# Patient Record
Sex: Male | Born: 1955 | Race: White | Hispanic: No | Marital: Married | Smoking: Former smoker
Health system: Southern US, Community
[De-identification: ages and names within clinical notes are randomized; demographics above are authoritative.]

## PROBLEM LIST (undated history)

## (undated) DIAGNOSIS — T4145XA Adverse effect of unspecified anesthetic, initial encounter: Secondary | ICD-10-CM

## (undated) DIAGNOSIS — T8859XA Other complications of anesthesia, initial encounter: Secondary | ICD-10-CM

## (undated) DIAGNOSIS — G4730 Sleep apnea, unspecified: Secondary | ICD-10-CM

## (undated) DIAGNOSIS — M5137 Other intervertebral disc degeneration, lumbosacral region: Secondary | ICD-10-CM

## (undated) DIAGNOSIS — F419 Anxiety disorder, unspecified: Secondary | ICD-10-CM

## (undated) DIAGNOSIS — M51379 Other intervertebral disc degeneration, lumbosacral region without mention of lumbar back pain or lower extremity pain: Secondary | ICD-10-CM

## (undated) DIAGNOSIS — I10 Essential (primary) hypertension: Secondary | ICD-10-CM

## (undated) HISTORY — DX: Other intervertebral disc degeneration, lumbosacral region without mention of lumbar back pain or lower extremity pain: M51.379

## (undated) HISTORY — PX: APPENDECTOMY: SHX54

## (undated) HISTORY — DX: Other intervertebral disc degeneration, lumbosacral region: M51.37

## (undated) HISTORY — DX: Morbid (severe) obesity due to excess calories: E66.01

## (undated) HISTORY — DX: Sleep apnea, unspecified: G47.30

## (undated) HISTORY — DX: Essential (primary) hypertension: I10

---

## 2006-01-12 HISTORY — PX: ROTATOR CUFF REPAIR: SHX139

## 2006-01-12 HISTORY — PX: ANTERIOR FUSION CERVICAL SPINE: SUR626

## 2007-01-13 HISTORY — PX: POSTERIOR CERVICAL FUSION/FORAMINOTOMY: SHX5038

## 2012-06-15 ENCOUNTER — Ambulatory Visit (INDEPENDENT_AMBULATORY_CARE_PROVIDER_SITE_OTHER): Payer: Managed Care, Other (non HMO) | Admitting: General Surgery

## 2012-06-30 ENCOUNTER — Encounter (INDEPENDENT_AMBULATORY_CARE_PROVIDER_SITE_OTHER): Payer: Self-pay | Admitting: Surgery

## 2012-06-30 ENCOUNTER — Ambulatory Visit (INDEPENDENT_AMBULATORY_CARE_PROVIDER_SITE_OTHER): Payer: Managed Care, Other (non HMO) | Admitting: Surgery

## 2012-06-30 ENCOUNTER — Other Ambulatory Visit (INDEPENDENT_AMBULATORY_CARE_PROVIDER_SITE_OTHER): Payer: Self-pay

## 2012-06-30 DIAGNOSIS — M542 Cervicalgia: Secondary | ICD-10-CM

## 2012-06-30 DIAGNOSIS — Z6841 Body Mass Index (BMI) 40.0 and over, adult: Secondary | ICD-10-CM

## 2012-06-30 DIAGNOSIS — E039 Hypothyroidism, unspecified: Secondary | ICD-10-CM

## 2012-06-30 DIAGNOSIS — M549 Dorsalgia, unspecified: Secondary | ICD-10-CM

## 2012-06-30 DIAGNOSIS — I10 Essential (primary) hypertension: Secondary | ICD-10-CM

## 2012-06-30 NOTE — Progress Notes (Addendum)
Re:   Fred Acosta DOB:   1955/03/15 MRN:   [id number]  ASSESSMENT AND PLAN: 1.  Morbid obesity,   Initial weight - 272, BMI - 42.03  The patient is interested in the lap sleeve.  Per the 1991 NIH Consensus Statement, the patient is a candidate for bariatric surgery.  The patient attended our initial information session and reviewed the types of bariatric surgery.    The patient is interested in the laparoscopic sleeve gastrectomy.  I discussed with the patient the indications and risks of bariatric surgery.  The potential risks of surgery include, but are not limited to, bleeding, infection, leak from the bowel, DVT and PE, open surgery, long term nutrition consequences, and death.  The patient understands the importance of compliance and long term follow-up with our group after surgery.  From here we will obtain lab tests, x-rays, nutrition consult, and psych consult.  I told the patient that Dr. Biagio Quint is our primary sleeve surgeon and that I am just getting started in this procedure.  He would like to see Dr. Biagio Quint.  I can get the ball rolling and he will see Dr. Biagio Quint preoperatively.  2.  Chronic neck/back pain issues  Has pain pump placed and followed by [facility].  Takes oxycodone, though the pain pump has helped him cut back of the medicine. 3.  Hypertension  Just started on meds in the last week. 4.  Used to smoke heavily.  Now smokes 1 - 3 cigs per day.  He knows that he has to stop at least 4 weeks prior to any surgery 5.  On disability for back pain since 2007. 6.  History of anxiety.  Chief Complaint  Patient presents with  . Bariatric Pre-op   REFERRING PHYSICIAN:  Dr. Clement Husbands, [place]  (FAX [phone])  HISTORY OF PRESENT ILLNESS: Fred Acosta is a 57 y.o. (DOB: 11/27/1955)  white  male whose primary care physician is Dr. Clement Husbands, [place]  (FAX [phone]) and comes to me today for evaluation for bariatric  surgery.  He sees Dr. Clement Husbands in [place], [place].   The patient went to an information session that Dr. Ezzard Standing spoke at. He is interested in a sleeve gastrectomy.   And he has looked at all options for weight loss surgery.  He has tried multiple diets including: Atkins diet, [employer], calorie count, and no fat diet. He has not tried any weight loss medications either prescription or nonprescription. He says he is on a lot of research on all options for weight loss surgery and thinks that the lap sleeve gastrectomy is his best option.  We talked about his pain pump and when to remove it.  But I think that he is best leaving this in to see how his weight loss affects his chronic pain issues.  And even though we hope that significant weight loss will help his pain, I discussed with him that bariatric surgery (any of the operations) has not been very effective improving chronic pain, no matter which operation is used.  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 I talked with him that Dr. Biagio Quint does most of lap sleeves.  And he would be one of my first sleeves, if he let me do the surgery.  He is interested in seeing Dr. Biagio Quint.    He also needs to  bring his wife with him to the next visit.   No past medical history on file.    Past Surgical History  Procedure Laterality Date  . Rotator cuff repair  2008  . Anterior fusion cervical spine  2008  . Posterior cervical fusion/foraminotomy  2009     Current Outpatient Prescriptions  Medication Sig Dispense Refill  . bethanechol (URECHOLINE) 10 MG tablet 10 mg daily. Three 10mg  qd/30mg       . lisinopril (PRINIVIL,ZESTRIL) 10 MG tablet 10 mg daily.       . MORPHINE SULFATE MICROINFUSION IJ Inject as directed.      . Oxycodone HCl 10 MG TABS 10 mg daily. Four 10mg  qd       No current facility-administered medications for this visit.      No Known Allergies  REVIEW OF SYSTEMS: Skin:  No history of rash.  No history of abnormal moles. Infection:  No history of hepatitis or HIV.  No history of MRSA. Neurologic:  No history of stroke.  No history of seizure.  No history of headaches. Cardiac:  Hypertension.  Just started on meds. No history of heart disease.  No history of prior cardiac catheterization.  No history of seeing a cardiologist. Pulmonary:  Used to be a heavy smoker.  Now smokes 1 to 3 cigarettes/day.  He knows that he need to stop smoking at least one month ahead of the operation.  Endocrine:  No diabetes. No thyroid disease. Gastrointestinal:  No history of stomach disease.  No history of liver disease.  No history of gall bladder disease.  No history of pancreas disease.  No history of colon disease. He had an appendectomy about 1999.  Colonoscopy around 2007. Urologic:  No history of kidney stones.  No history of bladder infections. Musculoskeletal:  Has had 2 neck operations - 2009 - Dr. Rogelia Boga in [place], and 2011 - Dr. Manson Passey at [facility].  He continues to have pain issues.  He said that his SI joint on the right is bad and he has sciatic nerve pain down his right leg.  He is seen at the [facility] in [place]. Hematologic:  No bleeding disorder.  No history of anemia.  Not anticoagulated. Psycho-social:  The patient is oriented.   The patient has no obvious psychologic or social impairment to understanding our conversation and plan.  SOCIAL and FAMILY HISTORY: Married.  Wife's name is Velna Hatchet. Wife works as Physiological scientist a company that makes chaff (glass and aluminum) used by [employer] to [employer]. They have one son - 84 yo.  PHYSICAL EXAM: BP 138/84  Pulse 84  Temp(Src) 98.4 F (36.9 C)  Resp 18  Ht 5' 7.5" (1.715 m)  Wt 272 lb 6.4 oz (123.56 kg)  BMI 42.01 kg/m2  General: WN obese WM who is alert and generally healthy appearing.  HEENT: Normal. Pupils  equal. Neck: Supple. No mass.  No thyroid mass.  Stout neck. Lymph Nodes:  No supraclavicular or cervical nodes. Lungs: Clear to auscultation and symmetric breath sounds. Heart:  RRR. No murmur or rub. Abdomen: Soft. No tenderness. No hernia. Normal bowel sounds.  Pain pump is RUQ, about 6 cm across.  He is about 1/2 apple and 1/2 pear in shape.  Scar from appendectomy. Rectal: Not done. Extremities:  Good strength and ROM  in upper and lower extremities. Neurologic:  Grossly intact to motor and sensory function. Psychiatric: Has normal mood and affect. Behavior is normal.  DATA REVIEWED: Notes from [facility], Dr. Electa Sniff.  Ovidio Kin, MD,  [facility] [address]  [address]   [place]    [address] Phone:  [phone] FAX:  [phone]

## 2012-07-25 ENCOUNTER — Encounter (INDEPENDENT_AMBULATORY_CARE_PROVIDER_SITE_OTHER): Payer: Self-pay

## 2012-08-01 ENCOUNTER — Ambulatory Visit: Payer: Self-pay | Admitting: *Deleted

## 2012-08-02 ENCOUNTER — Other Ambulatory Visit (INDEPENDENT_AMBULATORY_CARE_PROVIDER_SITE_OTHER): Payer: Self-pay | Admitting: Surgery

## 2012-08-02 ENCOUNTER — Inpatient Hospital Stay (HOSPITAL_COMMUNITY): Admission: RE | Admit: 2012-08-02 | Payer: Self-pay | Source: Ambulatory Visit

## 2012-08-02 ENCOUNTER — Other Ambulatory Visit: Payer: Self-pay

## 2012-08-02 ENCOUNTER — Ambulatory Visit (HOSPITAL_COMMUNITY): Payer: Private Health Insurance - Indemnity

## 2012-08-02 ENCOUNTER — Ambulatory Visit (HOSPITAL_COMMUNITY)
Admission: RE | Admit: 2012-08-02 | Discharge: 2012-08-02 | Disposition: A | Discharge status: Home / Self Care | Payer: Private Health Insurance - Indemnity | Source: Ambulatory Visit | Attending: Surgery | Admitting: Surgery

## 2012-08-02 ENCOUNTER — Encounter: Payer: Self-pay | Admitting: *Deleted

## 2012-08-02 ENCOUNTER — Encounter: Payer: Private Health Insurance - Indemnity | Attending: Surgery | Admitting: *Deleted

## 2012-08-02 DIAGNOSIS — M549 Dorsalgia, unspecified: Secondary | ICD-10-CM | POA: Insufficient documentation

## 2012-08-02 DIAGNOSIS — G8929 Other chronic pain: Secondary | ICD-10-CM | POA: Insufficient documentation

## 2012-08-02 DIAGNOSIS — K7689 Other specified diseases of liver: Secondary | ICD-10-CM | POA: Insufficient documentation

## 2012-08-02 DIAGNOSIS — F17200 Nicotine dependence, unspecified, uncomplicated: Secondary | ICD-10-CM | POA: Insufficient documentation

## 2012-08-02 DIAGNOSIS — Z713 Dietary counseling and surveillance: Secondary | ICD-10-CM | POA: Insufficient documentation

## 2012-08-02 DIAGNOSIS — Z6841 Body Mass Index (BMI) 40.0 and over, adult: Secondary | ICD-10-CM | POA: Insufficient documentation

## 2012-08-02 DIAGNOSIS — I10 Essential (primary) hypertension: Secondary | ICD-10-CM | POA: Insufficient documentation

## 2012-08-02 DIAGNOSIS — I700 Atherosclerosis of aorta: Secondary | ICD-10-CM | POA: Insufficient documentation

## 2012-08-02 DIAGNOSIS — K8020 Calculus of gallbladder without cholecystitis without obstruction: Secondary | ICD-10-CM | POA: Insufficient documentation

## 2012-08-02 DIAGNOSIS — K838 Other specified diseases of biliary tract: Secondary | ICD-10-CM | POA: Insufficient documentation

## 2012-08-02 DIAGNOSIS — E039 Hypothyroidism, unspecified: Secondary | ICD-10-CM

## 2012-08-02 LAB — COMPREHENSIVE METABOLIC PANEL
ALT: 18 U/L (ref 0–53)
BUN: 26 mg/dL — ABNORMAL HIGH (ref 6–23)
CO2: 29 mEq/L (ref 19–32)
Creat: 1.15 mg/dL (ref 0.50–1.35)
Total Bilirubin: 0.4 mg/dL (ref 0.3–1.2)

## 2012-08-02 LAB — CBC WITH DIFFERENTIAL/PLATELET
Eosinophils Absolute: 0.2 10*3/uL (ref 0.0–0.7)
Eosinophils Relative: 2 % (ref 0–5)
Lymphs Abs: 2.1 10*3/uL (ref 0.7–4.0)
MCH: 29.2 pg (ref 26.0–34.0)
MCV: 84.7 fL (ref 78.0–100.0)
Platelets: 183 10*3/uL (ref 150–400)
RBC: 4.38 MIL/uL (ref 4.22–5.81)
RDW: 14.4 % (ref 11.5–15.5)

## 2012-08-02 LAB — TSH: TSH: 3.64 u[IU]/mL (ref 0.350–4.500)

## 2012-08-02 NOTE — Progress Notes (Addendum)
  Pre-Op Assessment Visit:  Pre-Operative Sleeve Gastrectomy Surgery  Medical Nutrition Therapy:  Appt start time: 1130   End time:  1230.  Patient was seen on 08/02/2012 for Pre-Operative Sleeve Gastrectomy Nutrition Assessment. Assessment and letter of approval faxed to [facility] Bariatric Surgery Program coordinator on 08/02/2012.   Handouts given during visit include:  Pre-Op Goals Bariatric Surgery Protein Shakes  Patient to call the [facility] to enroll in Pre-Op and Post-Op Nutrition Education when surgery date is scheduled.

## 2012-08-02 NOTE — Patient Instructions (Addendum)
   Follow Pre-Op Nutrition Goals to prepare for Sleeve Gastrectomy Surgery.   Call the [facility] at [phone] once you have been given your surgery date to enrolled in the Pre-Op Nutrition Class. You will need to attend this nutrition class 3-4 weeks prior to your surgery.  *Try PB2 powdered peanut butter ($4.99 @ [employer] and [employer]) - in peanut butter section

## 2012-08-12 ENCOUNTER — Ambulatory Visit (INDEPENDENT_AMBULATORY_CARE_PROVIDER_SITE_OTHER): Payer: Managed Care, Other (non HMO) | Admitting: General Surgery

## 2012-08-26 ENCOUNTER — Telehealth (INDEPENDENT_AMBULATORY_CARE_PROVIDER_SITE_OTHER): Payer: Self-pay

## 2012-08-26 NOTE — Telephone Encounter (Signed)
Return call from patient - patient advised of his upcoming appointment with Dr. Biagio Quint.  Patient advised that per Dr. Biagio Quint he need's to stop smoking and have not smoked or use any tobacco related products for at least 30 day's.  Patient states he smokes the electronic cigarettes and wasn't sure if this would be okay or not.  I advised patient that I was not sure if there's nicotine in the e-cig but, would find out and call patient back once I research this further.

## 2012-08-26 NOTE — Telephone Encounter (Signed)
Called and left message for patient to call our office.  RE:  Patient has upcoming appointment on 09/07/12 w/Dr. Biagio Quint for new bariatric initial, per Dr. Biagio Quint patient need's to have stopped smoking.  No exceptions, patient will not be scheduled for bariatric surgery if he has any trace of nicotine or any nicotine metabolites such as Chantix, Nicotine Gum, Patches, etc.  Patient was advised before appointment was made that he needed to stop smoking prior to patient being seen on his initial appointment, per Dr. Biagio Quint

## 2012-08-26 NOTE — Telephone Encounter (Signed)
Message copied by Maryan Puls on Fri Aug 26, 2012  1:54 PM ------      Message from: Lodema Pilot      Created: Mon Jul 04, 2012 10:52 AM      Regarding: RE: A sleeve patient       Thanks for the referral.  I would be happy to see him back.  I did notice that he is smoking and I will not do any sleeve on a current smoker.  Caelan Atchley, please make sure that I can see him back with plenty of time to make sure that he is not smoking and to do the usual preop tests.  I will do a nicotine test on these patients preoperatively to ensure that they have truly stopped smoking.            ----- Message -----         From: Kandis Cocking, MD         Sent: 06/30/2012   7:04 PM           To: Leandrew Koyanagi, Lodema Pilot, DO, #      Subject: A sleeve patient                                         Arlys John,      I saw this guy in the office today.  Unfortunately, no one picked up that he wanted a sleeve.      He comes from [place], which is about 1 [place]/2 hours from here, so I did not want to just make him come back.      I have worked him up and you can look at my note.  He is to see you at least 3 weeks before any surgery is scheduled to make sure all the bases are covered.  Also I am just about ready to do sleeves, but he did not want to be one of my first patients.      Talk to you later,            Onalee Hua             ------

## 2012-08-29 NOTE — Telephone Encounter (Signed)
Reviewed with Dr. Biagio Quint Electronic Cigarettes.  Per Dr. Biagio Quint anything that contains Nicotine surgery will be pushed out.  Patient MUST BE 30 consectuive day's without any form of Nicotine which includes:  Patch, Chantix, E-cigs, Nicotine Gum, etc;  Patient aware of this and understands that Dr. Biagio Quint will conduct a blood and urine test to confirm levels of Nicotine.  Patient again verbalized understanding.

## 2012-08-31 ENCOUNTER — Ambulatory Visit (INDEPENDENT_AMBULATORY_CARE_PROVIDER_SITE_OTHER): Payer: Managed Care, Other (non HMO) | Admitting: General Surgery

## 2012-09-07 ENCOUNTER — Encounter (INDEPENDENT_AMBULATORY_CARE_PROVIDER_SITE_OTHER): Payer: Self-pay | Admitting: General Surgery

## 2012-09-07 ENCOUNTER — Ambulatory Visit (INDEPENDENT_AMBULATORY_CARE_PROVIDER_SITE_OTHER): Payer: Managed Care, Other (non HMO) | Admitting: General Surgery

## 2012-09-07 ENCOUNTER — Encounter (HOSPITAL_COMMUNITY)
Admission: RE | Disposition: A | Discharge status: Home / Self Care | Payer: Self-pay | Source: Ambulatory Visit | Attending: Surgery

## 2012-09-07 ENCOUNTER — Ambulatory Visit (HOSPITAL_COMMUNITY)
Admission: RE | Admit: 2012-09-07 | Discharge: 2012-09-07 | Disposition: A | Discharge status: Home / Self Care | Payer: Managed Care, Other (non HMO) | Source: Ambulatory Visit | Attending: Surgery | Admitting: Surgery

## 2012-09-07 DIAGNOSIS — F17200 Nicotine dependence, unspecified, uncomplicated: Secondary | ICD-10-CM

## 2012-09-07 HISTORY — PX: BREATH TEK H PYLORI: SHX5422

## 2012-09-07 SURGERY — BREATH TEST, FOR HELICOBACTER PYLORI

## 2012-09-07 NOTE — Progress Notes (Signed)
Patient ID: Fred Acosta, male   DOB: March 13, 1955, 57 y.o.   MRN: [id number]  Chief Complaint  Patient presents with  . Bariatric Pre-op    HPI NILSON Acosta is a 57 y.o. male. This patient comes in today for evaluation for possible vertical sleeve gastrectomy. He initially saw Dr. Ezzard Standing and expresses interest in sleeve gastrectomy and he was referred to me. He has a tender information session and has a BMI of 42 with obstructive sleep apnea hypertension and back and hip pain. He says that he struggled with his weight for "quite a while". He has tried several diets including Atkins diet, low fat diet, [employer] but was most effective with the [employer] when he lost about 20 pounds but he regained the weight. He says that he has some chronic back and hip pain and his exercise is limited by this but he can walk about one to 2 blocks before he has the pain. He denies any heartburn or reflux. HPI  Past Medical History  Diagnosis Date  . Morbid obesity   . Hypertension   . Sleep apnea     Unable to tolerate CPAP machine  . DDD (degenerative disc disease), lumbosacral     Past Surgical History  Procedure Laterality Date  . Rotator cuff repair  2008  . Anterior fusion cervical spine  2008  . Posterior cervical fusion/foraminotomy  2009    Family History  Problem Relation Age of Onset  . Heart disease Father   . Stroke Father     Social History History  Substance Use Topics  . Smoking status: Light Tobacco Smoker -- 0.13 packs/day    Types: Cigarettes    Last Attempt to Quit: 08/03/2010  . Smokeless tobacco: Not on file  . Alcohol Use: No    No Known Allergies  No current outpatient prescriptions on file.   No current facility-administered medications for this visit.    Review of Systems Review of Systems All other review of systems negative or noncontributory except as stated in the HPI  Blood pressure 128/64, pulse 62, resp. rate 14, height 5' 7.5" (1.715  m), weight 275 lb 6.4 oz (124.921 kg).  Physical Exam Physical Exam Physical Exam  Physical Exam  Vitals reviewed. Constitutional: He is oriented to person, place, and time. He appears well-developed and well-nourished. No distress.  HENT:  Head: Normocephalic and atraumatic.  Mouth/Throat: No oropharyngeal exudate.  Eyes: Conjunctivae and EOM are normal. Pupils are equal, round, and reactive to light. Right eye exhibits no discharge. Left eye exhibits no discharge. No scleral icterus.  Neck: Normal range of motion. No tracheal deviation present.  Cardiovascular: Normal rate, regular rhythm and normal heart sounds.   Pulmonary/Chest: Effort normal and breath sounds normal. No stridor. No respiratory distress. He has no wheezes. He has no rales. He exhibits no tenderness.  Abdominal: Soft. Bowel sounds are normal. He exhibits no distension and no mass. There is no tenderness. There is no rebound and no guarding.  Musculoskeletal: Normal range of motion. He exhibits no edema and no tenderness.  Neurological: He is alert and oriented to person, place, and time.  Skin: Skin is warm and dry. No rash noted. He is not diaphoretic. No erythema. No pallor.  Psychiatric: He has a normal mood and affect. His behavior is normal. Judgment and thought content normal.      Data Reviewed   Assessment    Morbid obesity with a BMI of 42 and obesity  related comorbidities of obstructive sleep apnea requiring CPAP, hypertension, back and hip pain We discussed all the nonsurgical and surgical options for weight loss including the lap band, sleeve gastrectomy,  gastric bypass. He would be a fine candidate for any of these options and he chooses when he can stop smoking. I discussed with him each of the procedures and their risks and he remains interested in sleeve gastrectomy.  The risks of infection, bleeding, pain, scarring, weight regain, too little or too much weight loss, vitamin deficiencies and need  for lifelong vitamin supplementation, hair loss, need for protein supplementation, leaks, stricture, reflux, food intolerance, need for reoperation and conversion to roux Y gastric bypass, need for open surgery, injury to spleen or surrounding structures, DVT's, PE, and death again discussed with the patient and the patient expressed understanding and desires to proceed with laparoscopic vertical sleeve gastrectomy, possible open, intraoperative endoscopy.  Explain the he has a higher competition rates associated with smoking and will not offer any of these options he is smoking. He has completed most of his workup and will be ready for surgery when he can stop smoking.          Plan    We will set him up for his psychology evaluation and he will need smoking cessation prior to scheduling any surgery.       Lodema Pilot DAVID 09/07/2012, 9:35 AM

## 2012-09-12 ENCOUNTER — Encounter (HOSPITAL_COMMUNITY): Payer: Self-pay | Admitting: Surgery

## 2012-10-17 ENCOUNTER — Telehealth (INDEPENDENT_AMBULATORY_CARE_PROVIDER_SITE_OTHER): Payer: Self-pay | Admitting: *Deleted

## 2012-10-17 DIAGNOSIS — Z01812 Encounter for preprocedural laboratory examination: Secondary | ICD-10-CM

## 2012-10-17 DIAGNOSIS — Z87891 Personal history of nicotine dependence: Secondary | ICD-10-CM

## 2012-10-17 NOTE — Addendum Note (Signed)
Addended by: Maryan Puls on: 10/17/2012 02:23 PM   Modules accepted: Orders

## 2012-10-17 NOTE — Telephone Encounter (Addendum)
Called and spoke to patient.  He would like to have his Blood Nicotine test completed at his PCP.  Order will be entered in [facility] and mailed to patient per his request

## 2012-10-17 NOTE — Telephone Encounter (Signed)
Patient called this morning asking to speak with [facility].  Explained that she is working with a MD at this time and offered to help however patient states he just needs to speak with her and ask her some questions.  Explained that a message will be sent to her to ask for her to call patient when she is able too.

## 2012-10-17 NOTE — Telephone Encounter (Signed)
Lab order slip for Nicotine Blood Work mailed to patient home address as listed in patient medical record (EPIC)

## 2012-10-27 ENCOUNTER — Other Ambulatory Visit (INDEPENDENT_AMBULATORY_CARE_PROVIDER_SITE_OTHER): Payer: Self-pay | Admitting: General Surgery

## 2012-10-29 ENCOUNTER — Encounter (INDEPENDENT_AMBULATORY_CARE_PROVIDER_SITE_OTHER): Payer: Self-pay | Admitting: General Surgery

## 2012-11-07 ENCOUNTER — Encounter (INDEPENDENT_AMBULATORY_CARE_PROVIDER_SITE_OTHER): Payer: Self-pay | Admitting: General Surgery

## 2012-11-08 ENCOUNTER — Encounter (INDEPENDENT_AMBULATORY_CARE_PROVIDER_SITE_OTHER): Payer: Self-pay | Admitting: General Surgery

## 2012-11-08 ENCOUNTER — Telehealth (INDEPENDENT_AMBULATORY_CARE_PROVIDER_SITE_OTHER): Payer: Self-pay

## 2012-11-08 NOTE — Telephone Encounter (Signed)
Called and left message for patient to call out office to discuss questions sent via [facility].

## 2012-11-17 ENCOUNTER — Other Ambulatory Visit: Payer: Self-pay

## 2012-11-23 ENCOUNTER — Encounter (INDEPENDENT_AMBULATORY_CARE_PROVIDER_SITE_OTHER): Payer: Self-pay | Admitting: General Surgery

## 2012-11-23 ENCOUNTER — Encounter (INDEPENDENT_AMBULATORY_CARE_PROVIDER_SITE_OTHER): Payer: Self-pay

## 2012-11-23 ENCOUNTER — Other Ambulatory Visit (INDEPENDENT_AMBULATORY_CARE_PROVIDER_SITE_OTHER): Payer: Self-pay

## 2012-11-28 ENCOUNTER — Encounter (HOSPITAL_COMMUNITY): Payer: Self-pay | Admitting: Pharmacy Technician

## 2012-11-29 ENCOUNTER — Other Ambulatory Visit (HOSPITAL_COMMUNITY): Payer: Self-pay | Admitting: General Surgery

## 2012-11-29 NOTE — Patient Instructions (Addendum)
[name]  11/29/2012   Your procedure is scheduled on:  12/06/12  TUESDAY  Report to [facility] at  1145    AM.  Call this number if you have problems the morning of surgery: [phone]       Remember:   Do not eat food:After Midnight. Monday NIGHT-  May have clear liquids  Tuesday AM UNTIL  0815am-  THEN NOTHING BY MOUTH   Take these medicines the morning of surgery with A SIP OF WATER:  Sertaline                                               May take OXYCODONE IF NEEDED   .  Contacts, dentures or partial plates can not be worn to surgery  Leave suitcase in the car. After surgery it may be brought to your room.  For patients admitted to the hospital, checkout time is 11:00 AM day of  discharge.             SPECIAL INSTRUCTIONS- SEE [facility] PREPARING FOR SURGERY INSTRUCTION SHEET-     DO NOT WEAR JEWELRY, LOTIONS, POWDERS, OR PERFUMES.  WOMEN-- DO NOT SHAVE LEGS OR UNDERARMS FOR 12 HOURS BEFORE SHOWERS. MEN MAY SHAVE FACE.  Patients discharged the day of surgery will not be allowed to drive home. IF going home the day of surgery, you must have a driver and someone to stay with you for the first 24 hours  Name and phone number of your driver:  admisssion                                                                        Fred Acosta  PST [phone]                 FAILURE TO FOLLOW THESE INSTRUCTIONS MAY RESULT IN  CANCELLATION   OF YOUR SURGERY                                                  Patient Signature _____________________________

## 2012-11-30 ENCOUNTER — Ambulatory Visit (INDEPENDENT_AMBULATORY_CARE_PROVIDER_SITE_OTHER): Payer: Managed Care, Other (non HMO) | Admitting: General Surgery

## 2012-11-30 ENCOUNTER — Encounter (INDEPENDENT_AMBULATORY_CARE_PROVIDER_SITE_OTHER): Payer: Self-pay

## 2012-11-30 ENCOUNTER — Ambulatory Visit (HOSPITAL_COMMUNITY)
Admission: RE | Admit: 2012-11-30 | Discharge: 2012-11-30 | Disposition: A | Discharge status: Home / Self Care | Payer: Managed Care, Other (non HMO) | Source: Ambulatory Visit | Attending: General Surgery | Admitting: General Surgery

## 2012-11-30 ENCOUNTER — Encounter: Payer: Managed Care, Other (non HMO) | Admitting: Dietician

## 2012-11-30 ENCOUNTER — Encounter (INDEPENDENT_AMBULATORY_CARE_PROVIDER_SITE_OTHER): Payer: Self-pay | Admitting: General Surgery

## 2012-11-30 ENCOUNTER — Ambulatory Visit: Payer: Managed Care, Other (non HMO) | Admitting: *Deleted

## 2012-11-30 ENCOUNTER — Encounter (HOSPITAL_COMMUNITY): Payer: Self-pay

## 2012-11-30 ENCOUNTER — Encounter (HOSPITAL_COMMUNITY)
Admission: RE | Admit: 2012-11-30 | Discharge: 2012-11-30 | Disposition: A | Discharge status: Home / Self Care | Payer: Managed Care, Other (non HMO) | Source: Ambulatory Visit | Attending: General Surgery | Admitting: General Surgery

## 2012-11-30 DIAGNOSIS — Z01812 Encounter for preprocedural laboratory examination: Secondary | ICD-10-CM | POA: Insufficient documentation

## 2012-11-30 DIAGNOSIS — Z01818 Encounter for other preprocedural examination: Secondary | ICD-10-CM | POA: Insufficient documentation

## 2012-11-30 HISTORY — DX: Other complications of anesthesia, initial encounter: T88.59XA

## 2012-11-30 HISTORY — DX: Anxiety disorder, unspecified: F41.9

## 2012-11-30 HISTORY — DX: Adverse effect of unspecified anesthetic, initial encounter: T41.45XA

## 2012-11-30 LAB — CBC WITH DIFFERENTIAL/PLATELET
Basophils Absolute: 0 10*3/uL (ref 0.0–0.1)
Eosinophils Absolute: 0.1 10*3/uL (ref 0.0–0.7)
Eosinophils Relative: 2 % (ref 0–5)
HCT: 37 % — ABNORMAL LOW (ref 39.0–52.0)
MCH: 29.3 pg (ref 26.0–34.0)
MCV: 88.7 fL (ref 78.0–100.0)
Monocytes Absolute: 0.4 10*3/uL (ref 0.1–1.0)
Platelets: 191 10*3/uL (ref 150–400)
RDW: 13 % (ref 11.5–15.5)

## 2012-11-30 LAB — COMPREHENSIVE METABOLIC PANEL
ALT: 20 U/L (ref 0–53)
AST: 22 U/L (ref 0–37)
CO2: 27 mEq/L (ref 19–32)
Calcium: 9.9 mg/dL (ref 8.4–10.5)
Creatinine, Ser: 1.1 mg/dL (ref 0.50–1.35)
GFR calc Af Amer: 83 mL/min — ABNORMAL LOW (ref >90)
GFR calc non Af Amer: 72 mL/min — ABNORMAL LOW (ref >90)
Glucose, Bld: 100 mg/dL — ABNORMAL HIGH (ref 70–99)
Sodium: 136 mEq/L (ref 135–145)
Total Protein: 7.8 g/dL (ref 6.0–8.3)

## 2012-11-30 NOTE — Progress Notes (Signed)
EKG 7/14 EPIC.  Has implanted Morphine pump with port right mid abdomen- states continuous infusion

## 2012-11-30 NOTE — Progress Notes (Signed)
Patient ID: Fred Acosta, male   DOB: 01/06/1956, 57 y.o.   MRN: [id number]  Chief Complaint  Patient presents with  . Bariatric Pre-op    HPI Fred Acosta is a 57 y.o. male.this patient presents for his preoperative surgery evaluation in preparation for vertical sleeve gastrectomy.he has completed his preoperative workup and has been cleared by the psychologist and the nutritionist.; He has a BMI of 43 with obesity related comorbidities of arthritis, obstructive sleep apnea, and hypertension. He stopped smoking shortly after our initial visit and says that he has not smoked since. He still has chronic pain from his neck and back and has a pain pump implanted in his right abdomen. He also has gallstones on ultrasound although he did not have any abdominal pain consistent with symptomatic cholelithiasis. HPI  Past Medical History  Diagnosis Date  . Morbid obesity   . Hypertension   . Sleep apnea     Unable to tolerate CPAP machine  . DDD (degenerative disc disease), lumbosacral     Past Surgical History  Procedure Laterality Date  . Rotator cuff repair  2008  . Anterior fusion cervical spine  2008  . Posterior cervical fusion/foraminotomy  2009  . Breath tek h pylori N/A 09/07/2012    Procedure: BREATH TEK H PYLORI;  Surgeon: Kandis Cocking, MD;  Location: [facility];  Service: General;  Laterality: N/A;    Family History  Problem Relation Age of Onset  . Heart disease Father   . Stroke Father     Social History History  Substance Use Topics  . Smoking status: Light Tobacco Smoker -- 0.13 packs/day    Types: Cigarettes    Last Attempt to Quit: 08/03/2010  . Smokeless tobacco: Not on file  . Alcohol Use: No    No Known Allergies  Current Outpatient Prescriptions  Medication Sig Dispense Refill  . cholecalciferol (VITAMIN D) 1000 UNITS tablet Take 1,000 Units by mouth daily.      Marland Kitchen lisinopril (PRINIVIL,ZESTRIL) 10 MG tablet Take 10 mg by mouth every morning.        Marland Kitchen MORPHINE SULFATE MICROINFUSION IJ Inject as directed continuous.       . Multiple Vitamin (MULTIVITAMIN WITH MINERALS) TABS tablet Take 1 tablet by mouth daily.      . Oxycodone HCl 10 MG TABS Take 10 mg by mouth 4 (four) times daily as needed (pain). Four 10mg  qd      . sertraline (ZOLOFT) 50 MG tablet Take 50 mg by mouth every morning.        No current facility-administered medications for this visit.    Review of Systems Review of Systems All other review of systems negative or noncontributory except as stated in the HPI  Blood pressure 138/84, pulse 71, temperature 97 F (36.1 C), temperature source Temporal, height 5' 7.5" (1.715 m), weight 275 lb 12.8 oz (125.102 kg).  Physical Exam Physical Exam Physical Exam  Vitals reviewed. Constitutional: He is oriented to person, place, and time. He appears well-developed and well-nourished. No distress.  HENT:  Head: Normocephalic and atraumatic.  Mouth/Throat: No oropharyngeal exudate.  Eyes: Conjunctivae and EOM are normal. Pupils are equal, round, and reactive to light. Right eye exhibits no discharge. Left eye exhibits no discharge. No scleral icterus.  Neck: Normal range of motion. No tracheal deviation present.  Cardiovascular: Normal rate, regular rhythm and normal heart sounds.   Pulmonary/Chest: Effort normal and breath sounds normal. No stridor. No respiratory distress. He has no  wheezes. He has no rales. He exhibits no tenderness.  Abdominal: Soft. Bowel sounds are normal. He exhibits no distension and no mass. There is no tenderness. There is no rebound and no guarding. he has a right upper quadrant incisions consistent with placement of his pain pump. Musculoskeletal: Normal range of motion. He exhibits no edema and no tenderness.  Neurological: He is alert and oriented to person, place, and time.  Skin: Skin is warm and dry. No rash noted. He is not diaphoretic. No erythema. No pallor.  Psychiatric: He has a normal mood  and affect. His behavior is normal. Judgment and thought content normal.    Data Reviewed Upper GI, ultrasound, labs  Assessment    Morbid obesity with a BMI of 43 and obesity related colitides of obstructive sleep apnea, hypertension, and arthritis I think that he would be a fine candidate for weight loss surgery and certainly would benefit from a weight loss. He says that he is on he lost about 12 pounds on his diet and he says that he already notices an improvement in his pain. We did discuss the options for medical and surgical weight loss including the options for lap band, sleeve gastrectomy, and Roux-en-Y gastric bypass. He remained motivated and interested in vertical sleeve gastrectomy and we will plan for surgery as scheduled. We discussed the surgery and the perioperative course and expectations and risks. The risks of infection, bleeding, pain, scarring, weight regain, too little or too much weight loss, vitamin deficiencies and need for lifelong vitamin supplementation, hair loss, need for protein supplementation, leaks, stricture, reflux, food intolerance, need for reoperation and conversion to roux Y gastric bypass, need for open surgery, injury to spleen or surrounding structures, DVT's, PE, and death again discussed with the patient and the patient expressed understanding and desires to proceed with laparoscopic vertical sleeve gastrectomy, possible open, intraoperative endoscopy. I also discussed with him the additional risk of injuring his pain pump which possibly could require replacement.    Plan    We will plan for surgery next week as already scheduled        Fred Acosta Fred Acosta 11/30/2012, 11:36 AM

## 2012-11-30 NOTE — Progress Notes (Signed)
Faxed cmet to Dr Biagio Quint through [id number]

## 2012-12-01 ENCOUNTER — Encounter (HOSPITAL_COMMUNITY): Payer: Self-pay

## 2012-12-06 ENCOUNTER — Encounter (HOSPITAL_COMMUNITY)
Admission: RE | Disposition: A | Discharge status: Home / Self Care | Payer: Self-pay | Source: Ambulatory Visit | Attending: General Surgery

## 2012-12-06 ENCOUNTER — Encounter (HOSPITAL_COMMUNITY): Payer: Self-pay

## 2012-12-06 ENCOUNTER — Inpatient Hospital Stay (HOSPITAL_COMMUNITY): Payer: Managed Care, Other (non HMO) | Admitting: Anesthesiology

## 2012-12-06 ENCOUNTER — Encounter (HOSPITAL_COMMUNITY): Payer: Managed Care, Other (non HMO) | Admitting: Anesthesiology

## 2012-12-06 ENCOUNTER — Inpatient Hospital Stay (HOSPITAL_COMMUNITY)
Admission: RE | Admit: 2012-12-06 | Discharge: 2012-12-12 | DRG: 621 | Disposition: A | Discharge status: Home / Self Care | Payer: Managed Care, Other (non HMO) | Source: Ambulatory Visit | Attending: General Surgery | Admitting: General Surgery

## 2012-12-06 DIAGNOSIS — Z6841 Body Mass Index (BMI) 40.0 and over, adult: Secondary | ICD-10-CM

## 2012-12-06 DIAGNOSIS — G4733 Obstructive sleep apnea (adult) (pediatric): Secondary | ICD-10-CM

## 2012-12-06 DIAGNOSIS — Z823 Family history of stroke: Secondary | ICD-10-CM

## 2012-12-06 DIAGNOSIS — E669 Obesity, unspecified: Secondary | ICD-10-CM | POA: Diagnosis present

## 2012-12-06 DIAGNOSIS — M51379 Other intervertebral disc degeneration, lumbosacral region without mention of lumbar back pain or lower extremity pain: Secondary | ICD-10-CM | POA: Diagnosis present

## 2012-12-06 DIAGNOSIS — F17200 Nicotine dependence, unspecified, uncomplicated: Secondary | ICD-10-CM | POA: Diagnosis present

## 2012-12-06 DIAGNOSIS — M5137 Other intervertebral disc degeneration, lumbosacral region: Secondary | ICD-10-CM | POA: Diagnosis present

## 2012-12-06 DIAGNOSIS — I10 Essential (primary) hypertension: Secondary | ICD-10-CM | POA: Diagnosis present

## 2012-12-06 DIAGNOSIS — Z79899 Other long term (current) drug therapy: Secondary | ICD-10-CM

## 2012-12-06 DIAGNOSIS — Z8249 Family history of ischemic heart disease and other diseases of the circulatory system: Secondary | ICD-10-CM

## 2012-12-06 HISTORY — PX: LAPAROSCOPIC GASTRIC SLEEVE RESECTION: SHX5895

## 2012-12-06 SURGERY — GASTRECTOMY, SLEEVE, LAPAROSCOPIC
Anesthesia: General | Site: Abdomen | Wound class: Clean Contaminated

## 2012-12-06 MED ORDER — HYDROMORPHONE HCL PF 1 MG/ML IJ SOLN
0.2500 mg | INTRAMUSCULAR | Status: AC | PRN
  Administered 2012-12-06 (×8): 0.5 mg via INTRAVENOUS

## 2012-12-06 MED ORDER — OXYCODONE HCL 5 MG/5ML PO SOLN
10.0000 mg | ORAL | Status: DC | PRN
  Administered 2012-12-07 – 2012-12-12 (×27): 10 mg via ORAL
  Filled 2012-12-06 (×28): qty 10

## 2012-12-06 MED ORDER — KCL IN DEXTROSE-NACL 20-5-0.45 MEQ/L-%-% IV SOLN
INTRAVENOUS | Status: DC
  Administered 2012-12-06 – 2012-12-07 (×2): via INTRAVENOUS
  Filled 2012-12-06 (×6): qty 1000

## 2012-12-06 MED ORDER — LIDOCAINE HCL (CARDIAC) 20 MG/ML IV SOLN
INTRAVENOUS | Status: AC
  Filled 2012-12-06: qty 5

## 2012-12-06 MED ORDER — ONDANSETRON HCL 4 MG/2ML IJ SOLN
INTRAMUSCULAR | Status: AC
  Filled 2012-12-06: qty 2

## 2012-12-06 MED ORDER — MORPHINE SULFATE 2 MG/ML IJ SOLN
2.0000 mg | INTRAMUSCULAR | Status: DC | PRN
  Administered 2012-12-06 (×2): 4 mg via INTRAVENOUS
  Administered 2012-12-07 (×2): 6 mg via INTRAVENOUS
  Administered 2012-12-07: 4 mg via INTRAVENOUS
  Administered 2012-12-07 (×2): 6 mg via INTRAVENOUS
  Administered 2012-12-07 (×2): 4 mg via INTRAVENOUS
  Administered 2012-12-07 (×3): 6 mg via INTRAVENOUS
  Administered 2012-12-09: 4 mg via INTRAVENOUS
  Filled 2012-12-06: qty 2
  Filled 2012-12-06 (×2): qty 3
  Filled 2012-12-06 (×3): qty 2
  Filled 2012-12-06: qty 3
  Filled 2012-12-06: qty 2
  Filled 2012-12-06: qty 3
  Filled 2012-12-06: qty 2
  Filled 2012-12-06 (×2): qty 3
  Filled 2012-12-06: qty 2
  Filled 2012-12-06: qty 3

## 2012-12-06 MED ORDER — KETOROLAC TROMETHAMINE 30 MG/ML IJ SOLN
30.0000 mg | Freq: Once | INTRAMUSCULAR | Status: AC
  Administered 2012-12-06: 30 mg via INTRAVENOUS

## 2012-12-06 MED ORDER — HEPARIN SODIUM (PORCINE) 5000 UNIT/ML IJ SOLN
INTRAMUSCULAR | Status: AC
  Filled 2012-12-06: qty 1

## 2012-12-06 MED ORDER — NEOSTIGMINE METHYLSULFATE 1 MG/ML IJ SOLN
INTRAMUSCULAR | Status: AC
  Filled 2012-12-06: qty 10

## 2012-12-06 MED ORDER — LIDOCAINE-EPINEPHRINE 1 %-1:100000 IJ SOLN
INTRAMUSCULAR | Status: DC | PRN
  Administered 2012-12-06: 22 mL

## 2012-12-06 MED ORDER — HYDROMORPHONE HCL PF 1 MG/ML IJ SOLN
INTRAMUSCULAR | Status: AC
  Filled 2012-12-06: qty 1

## 2012-12-06 MED ORDER — HYDRALAZINE HCL 20 MG/ML IJ SOLN
INTRAMUSCULAR | Status: AC
  Filled 2012-12-06: qty 1

## 2012-12-06 MED ORDER — PROPOFOL 10 MG/ML IV BOLUS
INTRAVENOUS | Status: DC | PRN
  Administered 2012-12-06: 200 mg via INTRAVENOUS

## 2012-12-06 MED ORDER — LIDOCAINE HCL (CARDIAC) 20 MG/ML IV SOLN
INTRAVENOUS | Status: DC | PRN
  Administered 2012-12-06: 75 mg via INTRAVENOUS

## 2012-12-06 MED ORDER — CISATRACURIUM BESYLATE 20 MG/10ML IV SOLN
INTRAVENOUS | Status: AC
  Filled 2012-12-06: qty 10

## 2012-12-06 MED ORDER — SUCCINYLCHOLINE CHLORIDE 20 MG/ML IJ SOLN
INTRAMUSCULAR | Status: AC
  Filled 2012-12-06: qty 2

## 2012-12-06 MED ORDER — ERTAPENEM SODIUM 1 G IJ SOLR
1.0000 g | Freq: Once | INTRAMUSCULAR | Status: AC
  Administered 2012-12-06: 1 g via INTRAVENOUS

## 2012-12-06 MED ORDER — HEPARIN SODIUM (PORCINE) 5000 UNIT/ML IJ SOLN
5000.0000 [IU] | Freq: Once | INTRAMUSCULAR | Status: AC
  Administered 2012-12-06: 5000 [IU] via SUBCUTANEOUS
  Filled 2012-12-06: qty 1

## 2012-12-06 MED ORDER — UNJURY CHOCOLATE CLASSIC POWDER: 2.0000 [oz_av] | Freq: Four times a day (QID) | ORAL | Status: DC

## 2012-12-06 MED ORDER — NEOSTIGMINE METHYLSULFATE 1 MG/ML IJ SOLN
INTRAMUSCULAR | Status: DC | PRN
  Administered 2012-12-06: 4 mg via INTRAVENOUS

## 2012-12-06 MED ORDER — SUCCINYLCHOLINE CHLORIDE 20 MG/ML IJ SOLN
INTRAMUSCULAR | Status: DC | PRN
  Administered 2012-12-06: 160 mg via INTRAVENOUS

## 2012-12-06 MED ORDER — LACTATED RINGERS IV SOLN
INTRAVENOUS | Status: DC
  Administered 2012-12-06: 1000 mL via INTRAVENOUS

## 2012-12-06 MED ORDER — KCL IN DEXTROSE-NACL 20-5-0.45 MEQ/L-%-% IV SOLN
INTRAVENOUS | Status: AC
  Filled 2012-12-06: qty 1000

## 2012-12-06 MED ORDER — ONDANSETRON HCL 4 MG/2ML IJ SOLN
4.0000 mg | INTRAMUSCULAR | Status: DC | PRN
  Administered 2012-12-06: 4 mg via INTRAVENOUS
  Filled 2012-12-06: qty 2

## 2012-12-06 MED ORDER — KETOROLAC TROMETHAMINE 30 MG/ML IJ SOLN
INTRAMUSCULAR | Status: AC
  Filled 2012-12-06: qty 1

## 2012-12-06 MED ORDER — MIDAZOLAM HCL 5 MG/5ML IJ SOLN
INTRAMUSCULAR | Status: DC | PRN
  Administered 2012-12-06 (×3): 0.5 mg via INTRAVENOUS

## 2012-12-06 MED ORDER — CISATRACURIUM BESYLATE (PF) 10 MG/5ML IV SOLN
INTRAVENOUS | Status: DC | PRN
  Administered 2012-12-06: 10 mg via INTRAVENOUS

## 2012-12-06 MED ORDER — DEXAMETHASONE SODIUM PHOSPHATE 10 MG/ML IJ SOLN
INTRAMUSCULAR | Status: AC
  Filled 2012-12-06: qty 1

## 2012-12-06 MED ORDER — LACTATED RINGERS IR SOLN
Status: DC | PRN
  Administered 2012-12-06: 2000 mL

## 2012-12-06 MED ORDER — HYDROMORPHONE HCL PF 1 MG/ML IJ SOLN
0.2500 mg | INTRAMUSCULAR | Status: DC | PRN
  Administered 2012-12-06 (×2): 0.5 mg via INTRAVENOUS

## 2012-12-06 MED ORDER — BUPIVACAINE HCL (PF) 0.25 % IJ SOLN
INTRAMUSCULAR | Status: AC
  Filled 2012-12-06: qty 30

## 2012-12-06 MED ORDER — HYDRALAZINE HCL 20 MG/ML IJ SOLN
5.0000 mg | INTRAMUSCULAR | Status: DC | PRN
  Administered 2012-12-06 (×2): 5 mg via INTRAVENOUS

## 2012-12-06 MED ORDER — GLYCOPYRROLATE 0.2 MG/ML IJ SOLN
INTRAMUSCULAR | Status: AC
  Filled 2012-12-06: qty 3

## 2012-12-06 MED ORDER — FENTANYL CITRATE 0.05 MG/ML IJ SOLN
INTRAMUSCULAR | Status: DC | PRN
  Administered 2012-12-06 (×5): 50 ug via INTRAVENOUS
  Administered 2012-12-06: 100 ug via INTRAVENOUS
  Administered 2012-12-06 (×3): 50 ug via INTRAVENOUS

## 2012-12-06 MED ORDER — PANTOPRAZOLE SODIUM 40 MG IV SOLR
40.0000 mg | INTRAVENOUS | Status: DC
  Administered 2012-12-06 – 2012-12-11 (×6): 40 mg via INTRAVENOUS
  Filled 2012-12-06 (×7): qty 40

## 2012-12-06 MED ORDER — ACETAMINOPHEN 10 MG/ML IV SOLN
1000.0000 mg | Freq: Once | INTRAVENOUS | Status: AC
  Administered 2012-12-06: 1000 mg via INTRAVENOUS
  Filled 2012-12-06: qty 100

## 2012-12-06 MED ORDER — GLYCOPYRROLATE 0.2 MG/ML IJ SOLN
INTRAMUSCULAR | Status: DC | PRN
  Administered 2012-12-06: .7 mg via INTRAVENOUS

## 2012-12-06 MED ORDER — ACETAMINOPHEN 160 MG/5ML PO SOLN
650.0000 mg | ORAL | Status: DC | PRN
  Administered 2012-12-08: 650 mg via ORAL
  Filled 2012-12-06: qty 20.3

## 2012-12-06 MED ORDER — MIDAZOLAM HCL 2 MG/2ML IJ SOLN
INTRAMUSCULAR | Status: AC
  Filled 2012-12-06: qty 2

## 2012-12-06 MED ORDER — DEXAMETHASONE SODIUM PHOSPHATE 10 MG/ML IJ SOLN
INTRAMUSCULAR | Status: DC | PRN
  Administered 2012-12-06: 10 mg via INTRAVENOUS

## 2012-12-06 MED ORDER — TISSEEL VH 10 ML EX KIT
PACK | CUTANEOUS | Status: DC | PRN
  Administered 2012-12-06: 10 mL

## 2012-12-06 MED ORDER — FENTANYL CITRATE 0.05 MG/ML IJ SOLN
INTRAMUSCULAR | Status: AC
  Filled 2012-12-06: qty 5

## 2012-12-06 MED ORDER — ENOXAPARIN SODIUM 40 MG/0.4ML ~~LOC~~ SOLN
40.0000 mg | Freq: Two times a day (BID) | SUBCUTANEOUS | Status: DC
  Filled 2012-12-06 (×2): qty 0.4

## 2012-12-06 MED ORDER — LIDOCAINE-EPINEPHRINE 1 %-1:100000 IJ SOLN
INTRAMUSCULAR | Status: AC
  Filled 2012-12-06: qty 1

## 2012-12-06 MED ORDER — UNJURY VANILLA POWDER
2.0000 [oz_av] | Freq: Four times a day (QID) | ORAL | Status: DC
  Administered 2012-12-08 – 2012-12-11 (×13): 2 [oz_av] via ORAL
  Administered 2012-12-11: 18:00:00 via ORAL
  Administered 2012-12-11: 2 [oz_av] via ORAL
  Administered 2012-12-11: 14:00:00 via ORAL

## 2012-12-06 MED ORDER — LACTATED RINGERS IV SOLN: INTRAVENOUS | Status: DC

## 2012-12-06 MED ORDER — UNJURY CHICKEN SOUP POWDER: 2.0000 [oz_av] | Freq: Four times a day (QID) | ORAL | Status: DC

## 2012-12-06 MED ORDER — BUPIVACAINE HCL 0.25 % IJ SOLN
INTRAMUSCULAR | Status: DC | PRN
  Administered 2012-12-06: 22 mL

## 2012-12-06 MED ORDER — SODIUM CHLORIDE 0.9 % IV SOLN
INTRAVENOUS | Status: AC
  Filled 2012-12-06: qty 1

## 2012-12-06 MED ORDER — PROPOFOL 10 MG/ML IV BOLUS
INTRAVENOUS | Status: AC
  Filled 2012-12-06: qty 20

## 2012-12-06 MED ORDER — ONDANSETRON HCL 4 MG/2ML IJ SOLN
INTRAMUSCULAR | Status: DC | PRN
  Administered 2012-12-06: 4 mg via INTRAVENOUS

## 2012-12-06 MED ORDER — SERTRALINE HCL 50 MG PO TABS
50.0000 mg | ORAL_TABLET | Freq: Every morning | ORAL | Status: DC
  Administered 2012-12-07 – 2012-12-11 (×5): 50 mg via ORAL
  Filled 2012-12-06 (×6): qty 1

## 2012-12-06 MED ORDER — TISSEEL VH 10 ML EX KIT
PACK | CUTANEOUS | Status: AC
  Filled 2012-12-06: qty 1

## 2012-12-06 SURGICAL SUPPLY — 50 items
APPLICATOR COTTON TIP 6IN STRL (MISCELLANEOUS) IMPLANT
APPLIER CLIP ROT 10 11.4 M/L (STAPLE)
BIOPATCH WHT 1IN DISK W/4.0 H (GAUZE/BANDAGES/DRESSINGS) ×2 IMPLANT
CABLE HIGH FREQUENCY MONO STRZ (ELECTRODE) IMPLANT
CANISTER SUCTION 2500CC (MISCELLANEOUS) ×2 IMPLANT
CHLORAPREP W/TINT 26ML (MISCELLANEOUS) ×4 IMPLANT
CLIP APPLIE ROT 10 11.4 M/L (STAPLE) IMPLANT
DERMABOND ADVANCED (GAUZE/BANDAGES/DRESSINGS) ×1
DERMABOND ADVANCED .7 DNX12 (GAUZE/BANDAGES/DRESSINGS) ×1 IMPLANT
DEVICE SUTURE ENDOST 10MM (ENDOMECHANICALS) IMPLANT
DRAIN CHANNEL 19F RND (DRAIN) ×2 IMPLANT
DRAPE LAPAROSCOPIC ABDOMINAL (DRAPES) ×2 IMPLANT
DRAPE UTILITY XL STRL (DRAPES) ×4 IMPLANT
DRSG TEGADERM 4X4.75 (GAUZE/BANDAGES/DRESSINGS) ×2 IMPLANT
ELECT REM PT RETURN 9FT ADLT (ELECTROSURGICAL) ×2
ELECTRODE REM PT RTRN 9FT ADLT (ELECTROSURGICAL) ×1 IMPLANT
EVACUATOR SILICONE 100CC (DRAIN) ×2 IMPLANT
GLOVE SURG SS PI 7.5 STRL IVOR (GLOVE) ×4 IMPLANT
GOWN PREVENTION PLUS LG XLONG (DISPOSABLE) IMPLANT
GOWN STRL REIN XL XLG (GOWN DISPOSABLE) ×6 IMPLANT
HANDLE STAPLE EGIA 4 XL (STAPLE) IMPLANT
HEMOSTAT SURGICEL 4X8 (HEMOSTASIS) ×2 IMPLANT
HOVERMATT SINGLE USE (MISCELLANEOUS) ×2 IMPLANT
KIT BASIN OR (CUSTOM PROCEDURE TRAY) ×2 IMPLANT
MARKER SKIN DUAL TIP RULER LAB (MISCELLANEOUS) ×2 IMPLANT
NEEDLE SPNL 22GX3.5 QUINCKE BK (NEEDLE) IMPLANT
NS IRRIG 1000ML POUR BTL (IV SOLUTION) ×2 IMPLANT
PENCIL BUTTON HOLSTER BLD 10FT (ELECTRODE) ×2 IMPLANT
POUCH SPECIMEN RETRIEVAL 10MM (ENDOMECHANICALS) IMPLANT
RELOAD EGIA 60 MED/THCK PURPLE (STAPLE) ×6 IMPLANT
RELOAD TRI 2.0 60 XTHK VAS SUL (STAPLE) IMPLANT
SCISSORS LAP 5X35 DISP (ENDOMECHANICALS) IMPLANT
SEALANT SURGICAL APPL DUAL CAN (MISCELLANEOUS) ×2 IMPLANT
SET IRRIG TUBING LAPAROSCOPIC (IRRIGATION / IRRIGATOR) ×2 IMPLANT
SHEARS CURVED HARMONIC AC 45CM (MISCELLANEOUS) ×2 IMPLANT
SLEEVE XCEL OPT CAN 5 100 (ENDOMECHANICALS) ×4 IMPLANT
SOLUTION ANTI FOG 6CC (MISCELLANEOUS) ×2 IMPLANT
SPONGE GAUZE 4X4 12PLY (GAUZE/BANDAGES/DRESSINGS) IMPLANT
SPONGE LAP 18X18 X RAY DECT (DISPOSABLE) ×2 IMPLANT
SUT ETHILON 2 0 PS N (SUTURE) ×2 IMPLANT
SUT MNCRL AB 4-0 PS2 18 (SUTURE) ×4 IMPLANT
SUT VICRYL 0 UR6 27IN ABS (SUTURE) ×2 IMPLANT
SYR 50ML LL SCALE MARK (SYRINGE) ×2 IMPLANT
TRAY FOLEY CATH 14FRSI W/METER (CATHETERS) ×2 IMPLANT
TRAY LAP CHOLE (CUSTOM PROCEDURE TRAY) ×2 IMPLANT
TROCAR BLADELESS 15MM (ENDOMECHANICALS) ×2 IMPLANT
TROCAR BLADELESS OPT 5 100 (ENDOMECHANICALS) ×2 IMPLANT
TUBING CONNECTING 10 (TUBING) ×2 IMPLANT
TUBING ENDO SMARTCAP (MISCELLANEOUS) ×2 IMPLANT
TUBING FILTER THERMOFLATOR (ELECTROSURGICAL) ×2 IMPLANT

## 2012-12-06 NOTE — Interval H&P Note (Signed)
History and Physical Interval Note:  12/06/2012 12:58 PM  Fred Acosta  has presented today for surgery, with the diagnosis of MORBID OBESITY   The various methods of treatment have been discussed with the patient and family. After consideration of risks, benefits and other options for treatment, the patient has consented to  Procedure(s): LAPAROSCOPIC GASTRIC SLEEVE RESECTION (N/A) as a surgical intervention .  The patient's history has been reviewed, patient examined, no change in status, stable for surgery.  I have reviewed the patient's chart and labs.  Questions were answered to the patient's satisfaction.  Pt seen in preop area.  Risks of the procedure again discussed in lay terms.  The risks of infection, bleeding, pain, scarring, weight regain, too little or too much weight loss, vitamin deficiencies and need for lifelong vitamin supplementation, hair loss, need for protein supplementation, leaks, stricture, reflux, food intolerance, need for reoperation and conversion to roux Y gastric bypass, need for open surgery, injury to spleen or surrounding structures, DVT's, PE, and death again discussed with the patient and the patient expressed understanding and desires to proceed with laparoscopic vertical sleeve gastrectomy, possible open, intraoperative endoscopy.     Lodema Pilot DAVID

## 2012-12-06 NOTE — Anesthesia Preprocedure Evaluation (Addendum)
Anesthesia Evaluation  Patient identified by MRN, date of birth, ID band Patient awake    Reviewed: Allergy & Precautions, H&P , NPO status , Patient's Chart, lab work & pertinent test results  Airway Mallampati: III TM Distance: >3 FB Neck ROM: Limited    Dental  (+) Edentulous Upper, Implants and Dental Advisory Given Upper edentulous but has posts implanted:   Pulmonary sleep apnea , Current Smoker,  breath sounds clear to auscultation  Pulmonary exam normal       Cardiovascular Exercise Tolerance: Good hypertension, Pt. on medications Rhythm:regular Rate:Normal     Neuro/Psych Anxiety Cervical fusion negative neurological ROS  negative psych ROS   GI/Hepatic negative GI ROS, Neg liver ROS,   Endo/Other  negative endocrine ROSMorbid obesity  Renal/GU negative Renal ROS  negative genitourinary   Musculoskeletal   Abdominal   Peds  Hematology negative hematology ROS (+)   Anesthesia Other Findings   Reproductive/Obstetrics negative OB ROS                         Anesthesia Physical Anesthesia Plan  ASA: III  Anesthesia Plan: General   Post-op Pain Management:    Induction: Intravenous  Airway Management Planned: Oral ETT and Video Laryngoscope Planned  Additional Equipment:   Intra-op Plan:   Post-operative Plan: Extubation in OR  Informed Consent: I have reviewed the patients History and Physical, chart, labs and discussed the procedure including the risks, benefits and alternatives for the proposed anesthesia with the patient or authorized representative who has indicated his/her understanding and acceptance.   Dental Advisory Given  Plan Discussed with: CRNA and Surgeon  Anesthesia Plan Comments:         Anesthesia Quick Evaluation

## 2012-12-06 NOTE — Op Note (Signed)
GAVIN TELFORD [id number] [birth year] 12/06/2012  Preoperative diagnosis: Morbid obesity  Postoperative diagnosis: Same   Procedure: Upper endoscopy   Surgeon: Mary Sella. Valdemar Mcclenahan M.D., FACS   Anesthesia: Gen.   Indications for procedure: 57year old WM undergoing Laparoscopic Gastric Sleeve Resection and an EGD was requested to evaluate the new gastric sleeve.   Description of procedure: After we have completed the sleeve resection, I scrubbed out and obtained the Olympus endoscope. I gently placed endoscope in the patient's oropharynx and gently glided it down the esophagus without any difficulty under direct visualization. Once I was in the gastric sleeve, I insufflated the stomach with air. I was able to cannulate and advanced the scope through the gastric sleeve. I was able to cannulate the duodenum with ease. Dr. Biagio Quint had placed saline in the upper abdomen. Upon further insufflation of the gastric sleeve there was no evidence of bubbles. Upon further inspection of the gastric sleeve, the mucosa appeared normal. There is no evidence of any mucosal abnormality. There was no twisting, corkscrew, or stricture of the sleeve. There was no evidence of bleeding. The gastric sleeve was decompressed. The scope was withdrawn. The patient tolerated this portion of the procedure well. Please see Dr Delice Lesch operative note for details regarding the laparoscopic gastric sleeve resection.   Mary Sella. Andrey Campanile, MD, FACS  General, Bariatric, & Minimally Invasive Surgery  [facility], [place]

## 2012-12-06 NOTE — Anesthesia Procedure Notes (Signed)
Procedure Name: Intubation Date/Time: 12/06/2012 2:07 PM Performed by: Edison Pace Pre-anesthesia Checklist: Patient identified, Timeout performed, Emergency Drugs available, Suction available and Patient being monitored Patient Re-evaluated:Patient Re-evaluated prior to inductionOxygen Delivery Method: Circle system utilized Preoxygenation: Pre-oxygenation with 100% oxygen Intubation Type: IV induction Ventilation: Mask ventilation with difficulty Grade View: Grade III Tube type: Glide rite Tube size: 7.5 mm Number of attempts: 1 Airway Equipment and Method: Video-laryngoscopy (elective glidescope due to limited neck mobility, decreased opening, small mouth) Placement Confirmation: ETT inserted through vocal cords under direct vision,  positive ETCO2 and breath sounds checked- equal and bilateral Secured at: 23 cm Tube secured with: Tape Dental Injury: Teeth and Oropharynx as per pre-operative assessment  Difficulty Due To: Difficulty was anticipated, Difficult Airway- due to anterior larynx, Difficult Airway- due to limited oral opening, Difficult Airway- due to dentition, Difficult Airway- due to reduced neck mobility and Difficult Airway- due to large tongue Future Recommendations: Recommend- induction with short-acting agent, and alternative techniques readily available

## 2012-12-06 NOTE — Anesthesia Postprocedure Evaluation (Signed)
Anesthesia Post Note  Patient: Fred Acosta  Procedure(s) Performed: Procedure(s) (LRB): LAPAROSCOPIC GASTRIC SLEEVE RESECTION (N/A)  Anesthesia type: Epidural  Patient location: PACU  Post pain: Pain level controlled  Post assessment: Post-op Vital signs reviewed  Last Vitals: BP 186/70  Pulse 76  Temp(Src) 36.8 C (Oral)  Resp 12  Ht 5\' 7"  (1.702 m)  Wt 273 lb 4 oz (123.945 kg)  BMI 42.79 kg/m2  SpO2 96%  Post vital signs: Reviewed  Level of consciousness: sedated  Complications: No apparent anesthesia complications

## 2012-12-06 NOTE — Transfer of Care (Signed)
Immediate Anesthesia Transfer of Care Note  Patient: Fred Acosta  Procedure(s) Performed: Procedure(s) (LRB): LAPAROSCOPIC GASTRIC SLEEVE RESECTION (N/A)  Patient Location: PACU  Anesthesia Type: General  Level of Consciousness: sedated, patient cooperative and responds to stimulation  Airway & Oxygen Therapy: Patient Spontanous Breathing and Patient connected to face mask oxgen  Post-op Assessment: Report given to PACU RN and Post -op Vital signs reviewed and stable  Post vital signs: Reviewed and stable  Complications: No apparent anesthesia complications

## 2012-12-06 NOTE — Brief Op Note (Signed)
12/06/2012  4:17 PM  PATIENT:  Fred Acosta  57 y.o. male  PRE-OPERATIVE DIAGNOSIS:  MORBID OBESITY   POST-OPERATIVE DIAGNOSIS:  morbid obesity   PROCEDURE:  Procedure(s): LAPAROSCOPIC GASTRIC SLEEVE RESECTION (N/A)  SURGEON:  Surgeon(s) and Role:    * Lodema Pilot, DO - Primary    * Atilano Ina, MD - Assisting  PHYSICIAN ASSISTANT:   ASSISTANTS: wilson   ANESTHESIA:   general  EBL:  Total I/O In: 1600 [I.V.:1600] Out: 450 [Urine:400; Blood:50]  BLOOD ADMINISTERED:none  DRAINS: (76F) Jackson-Pratt drain(s) with closed bulb suction in the sleeve staple line   LOCAL MEDICATIONS USED:  MARCAINE    and LIDOCAINE   SPECIMEN:  Source of Specimen:  stomach  DISPOSITION OF SPECIMEN:  PATHOLOGY  COUNTS:  YES  TOURNIQUET:  * No tourniquets in log *  DICTATION: .Other Dictation: Dictation Number dictated  PLAN OF CARE: Admit to inpatient   PATIENT DISPOSITION:  PACU - hemodynamically stable.   Delay start of Pharmacological VTE agent (>24hrs) due to surgical blood loss or risk of bleeding: no

## 2012-12-06 NOTE — H&P (View-Only) (Signed)
Patient ID: Fred Acosta, male   DOB: 09/28/1955, 57 y.o.   MRN: [id number]  Chief Complaint  Patient presents with  . Bariatric Pre-op    HPI Dmari G Acosta is a 57 y.o. male.this patient presents for his preoperative surgery evaluation in preparation for vertical sleeve gastrectomy.he has completed his preoperative workup and has been cleared by the psychologist and the nutritionist.; He has a BMI of 43 with obesity related comorbidities of arthritis, obstructive sleep apnea, and hypertension. He stopped smoking shortly after our initial visit and says that he has not smoked since. He still has chronic pain from his neck and back and has a pain pump implanted in his right abdomen. He also has gallstones on ultrasound although he did not have any abdominal pain consistent with symptomatic cholelithiasis. HPI  Past Medical History  Diagnosis Date  . Morbid obesity   . Hypertension   . Sleep apnea     Unable to tolerate CPAP machine  . DDD (degenerative disc disease), lumbosacral     Past Surgical History  Procedure Laterality Date  . Rotator cuff repair  2008  . Anterior fusion cervical spine  2008  . Posterior cervical fusion/foraminotomy  2009  . Breath tek h pylori N/A 09/07/2012    Procedure: BREATH TEK H PYLORI;  Surgeon: David H Newman, MD;  Location: [facility];  Service: General;  Laterality: N/A;    Family History  Problem Relation Age of Onset  . Heart disease Father   . Stroke Father     Social History History  Substance Use Topics  . Smoking status: Light Tobacco Smoker -- 0.13 packs/day    Types: Cigarettes    Last Attempt to Quit: 08/03/2010  . Smokeless tobacco: Not on file  . Alcohol Use: No    No Known Allergies  Current Outpatient Prescriptions  Medication Sig Dispense Refill  . cholecalciferol (VITAMIN D) 1000 UNITS tablet Take 1,000 Units by mouth daily.      . lisinopril (PRINIVIL,ZESTRIL) 10 MG tablet Take 10 mg by mouth every morning.        . MORPHINE SULFATE MICROINFUSION IJ Inject as directed continuous.       . Multiple Vitamin (MULTIVITAMIN WITH MINERALS) TABS tablet Take 1 tablet by mouth daily.      . Oxycodone HCl 10 MG TABS Take 10 mg by mouth 4 (four) times daily as needed (pain). Four 10mg qd      . sertraline (ZOLOFT) 50 MG tablet Take 50 mg by mouth every morning.        No current facility-administered medications for this visit.    Review of Systems Review of Systems All other review of systems negative or noncontributory except as stated in the HPI  Blood pressure 138/84, pulse 71, temperature 97 F (36.1 C), temperature source Temporal, height 5' 7.5" (1.715 m), weight 275 lb 12.8 oz (125.102 kg).  Physical Exam Physical Exam Physical Exam  Vitals reviewed. Constitutional: He is oriented to person, place, and time. He appears well-developed and well-nourished. No distress.  HENT:  Head: Normocephalic and atraumatic.  Mouth/Throat: No oropharyngeal exudate.  Eyes: Conjunctivae and EOM are normal. Pupils are equal, round, and reactive to light. Right eye exhibits no discharge. Left eye exhibits no discharge. No scleral icterus.  Neck: Normal range of motion. No tracheal deviation present.  Cardiovascular: Normal rate, regular rhythm and normal heart sounds.   Pulmonary/Chest: Effort normal and breath sounds normal. No stridor. No respiratory distress. He has no   wheezes. He has no rales. He exhibits no tenderness.  Abdominal: Soft. Bowel sounds are normal. He exhibits no distension and no mass. There is no tenderness. There is no rebound and no guarding. he has a right upper quadrant incisions consistent with placement of his pain pump. Musculoskeletal: Normal range of motion. He exhibits no edema and no tenderness.  Neurological: He is alert and oriented to person, place, and time.  Skin: Skin is warm and dry. No rash noted. He is not diaphoretic. No erythema. No pallor.  Psychiatric: He has a normal mood  and affect. His behavior is normal. Judgment and thought content normal.    Data Reviewed Upper GI, ultrasound, labs  Assessment    Morbid obesity with a BMI of 43 and obesity related colitides of obstructive sleep apnea, hypertension, and arthritis I think that he would be a fine candidate for weight loss surgery and certainly would benefit from a weight loss. He says that he is on he lost about 12 pounds on his diet and he says that he already notices an improvement in his pain. We did discuss the options for medical and surgical weight loss including the options for lap band, sleeve gastrectomy, and Roux-en-Y gastric bypass. He remained motivated and interested in vertical sleeve gastrectomy and we will plan for surgery as scheduled. We discussed the surgery and the perioperative course and expectations and risks. The risks of infection, bleeding, pain, scarring, weight regain, too little or too much weight loss, vitamin deficiencies and need for lifelong vitamin supplementation, hair loss, need for protein supplementation, leaks, stricture, reflux, food intolerance, need for reoperation and conversion to roux Y gastric bypass, need for open surgery, injury to spleen or surrounding structures, DVT's, PE, and death again discussed with the patient and the patient expressed understanding and desires to proceed with laparoscopic vertical sleeve gastrectomy, possible open, intraoperative endoscopy. I also discussed with him the additional risk of injuring his pain pump which possibly could require replacement.    Plan    We will plan for surgery next week as already scheduled        [name] 11/30/2012, 11:36 AM    

## 2012-12-07 ENCOUNTER — Encounter (HOSPITAL_COMMUNITY): Payer: Self-pay | Admitting: General Surgery

## 2012-12-07 ENCOUNTER — Inpatient Hospital Stay (HOSPITAL_COMMUNITY): Payer: Managed Care, Other (non HMO)

## 2012-12-07 LAB — CBC WITH DIFFERENTIAL/PLATELET
Basophils Absolute: 0 10*3/uL (ref 0.0–0.1)
Basophils Relative: 0 % (ref 0–1)
Eosinophils Absolute: 0 10*3/uL (ref 0.0–0.7)
Hemoglobin: 9.4 g/dL — ABNORMAL LOW (ref 13.0–17.0)
MCH: 29.6 pg (ref 26.0–34.0)
MCHC: 33.9 g/dL (ref 30.0–36.0)
Monocytes Relative: 4 % (ref 3–12)
Neutro Abs: 9.8 10*3/uL — ABNORMAL HIGH (ref 1.7–7.7)
Neutrophils Relative %: 88 % — ABNORMAL HIGH (ref 43–77)
Platelets: 213 10*3/uL (ref 150–400)
RDW: 12.9 % (ref 11.5–15.5)

## 2012-12-07 LAB — PROTIME-INR
INR: 1.12 (ref 0.00–1.49)
Prothrombin Time: 14.2 seconds (ref 11.6–15.2)

## 2012-12-07 LAB — COMPREHENSIVE METABOLIC PANEL
AST: 38 U/L — ABNORMAL HIGH (ref 0–37)
Albumin: 3.6 g/dL (ref 3.5–5.2)
Alkaline Phosphatase: 64 U/L (ref 39–117)
BUN: 50 mg/dL — ABNORMAL HIGH (ref 6–23)
CO2: 20 mEq/L (ref 19–32)
Chloride: 99 mEq/L (ref 96–112)
Creatinine, Ser: 1.51 mg/dL — ABNORMAL HIGH (ref 0.50–1.35)
GFR calc non Af Amer: 49 mL/min — ABNORMAL LOW (ref >90)
Potassium: 5 mEq/L (ref 3.5–5.1)
Total Protein: 6.7 g/dL (ref 6.0–8.3)

## 2012-12-07 LAB — HEMOGLOBIN AND HEMATOCRIT, BLOOD
HCT: 27.1 % — ABNORMAL LOW (ref 39.0–52.0)
Hemoglobin: 9 g/dL — ABNORMAL LOW (ref 13.0–17.0)
Hemoglobin: 8.7 g/dL — ABNORMAL LOW (ref 13.0–17.0)

## 2012-12-07 LAB — ABO/RH: ABO/RH(D): O POS

## 2012-12-07 MED ORDER — IOHEXOL 300 MG/ML  SOLN
50.0000 mL | Freq: Once | INTRAMUSCULAR | Status: AC | PRN
  Administered 2012-12-07: 50 mL via ORAL

## 2012-12-07 NOTE — Progress Notes (Signed)
Patient alert and oriented, pain is controlled. Patient is tolerating fluids, plan to advance to protein shake tomorrow.  Reviewed Gastric Bypass discharge instructions with patient and wife.  Patient is able to articulate understanding.  Provided information about BELT program, Support Group, and [facility] outpatient Pharmacy offerings.  Will continue to monitor

## 2012-12-07 NOTE — Progress Notes (Signed)
Pt having increased output from JP. Paged MD on call x2. Awaiting call back.

## 2012-12-07 NOTE — Progress Notes (Signed)
1 Day Post-Op  Subjective: Complains of pain.  Occasional nausea. Has had bloody drainage in JP overnight.  No chest pain or dizziness or SOB  Objective: Vital signs in last 24 hours: Temp:  [98.1 F (36.7 C)-98.7 F (37.1 C)] 98.2 F (36.8 C) (11/26 0715) Pulse Rate:  [61-96] 81 (11/26 0715) Resp:  [11-18] 16 (11/26 0715) BP: (96-213)/(43-93) 126/73 mmHg (11/26 0715) SpO2:  [95 %-100 %] 95 % (11/26 0715) Weight:  [273 lb 4 oz (123.945 kg)] 273 lb 4 oz (123.945 kg) (11/25 1133) Last BM Date: 12/05/12  Intake/Output from previous day: 11/25 0701 - 11/26 0700 In: 3408.8 [I.V.:3268.8; IV Piggyback:100] Out: 1130 [Urine:575; Drains:505; Blood:50] Intake/Output this shift:    General appearance: alert, cooperative and no distress Resp: nonlabored Cardio: normal rate, 88, regular GI: soft, appropriate upper abdominal tenderness, no lower abdominal tenderness or peritoneal signs, ND, wounds look okay, JP with bloody drainage, 45ml drained now Extremities: SCD's bilat  Lab Results:   Recent Labs  12/07/12 0415  WBC 11.2*  HGB 9.4*  HCT 27.7*  PLT 213   BMET  Recent Labs  12/07/12 0415  NA 133*  K 5.0  CL 99  CO2 20  GLUCOSE 230*  BUN 50*  CREATININE 1.51*  CALCIUM 8.5   PT/INR No results found for this basename: LABPROT, INR,  in the last 72 hours ABG No results found for this basename: PHART, PCO2, PO2, HCO3,  in the last 72 hours  Studies/Results: No results found.  Anti-infectives: Anti-infectives   Start     Dose/Rate Route Frequency Ordered Stop   12/06/12 1145  ertapenem (INVANZ) 1 g in sodium chloride 0.9 % 50 mL IVPB     1 g 100 mL/hr over 30 Minutes Intravenous  Once 12/06/12 1133 12/06/12 1400      Assessment/Plan: s/p Procedure(s): LAPAROSCOPIC GASTRIC SLEEVE RESECTION (N/A) Pain control is an issue as expected given his baseline med use and chronic pain. My main concern for him is the bloody drainage from drain.  I explained to him that I  am concerned for bleeding.  This may be from staple line, short gastrics or from spleen.  He is HD stable and I do not think that he needs emergent exploration now, but will follow HGB and clinically and hold anticoagulation.  Repeat HGB now pending.  If he continues to drop, then will consider transfusion or CT.  LOS: 1 day    Lodema Pilot DAVID 12/07/2012

## 2012-12-07 NOTE — Op Note (Signed)
Fred Acosta, Fred Acosta              ACCOUNT NO.:  [id number]  MEDICAL RECORD NO.:  [id number]  LOCATION:  1523                         FACILITY:  [facility]  PHYSICIAN:  Lodema Pilot, MD       DATE OF BIRTH:  [birth year]  DATE OF PROCEDURE:  12/06/2012 DATE OF DISCHARGE:                              OPERATIVE REPORT   PROCEDURE:  Laparoscopic vertical sleeve gastrectomy with intraoperative endoscopy.  PREOPERATIVE DIAGNOSIS:  Obesity.  POSTOPERATIVE DIAGNOSIS:  Obesity.  SURGEON:  Lodema Pilot, MD  ASSISTANT:  Mary Sella. Andrey Campanile, MD, FACS.  ANESTHESIA:  General endotracheal anesthesia with 45 mL of 1% lidocaine with epinephrine and 0.25% Marcaine in a 50:50 mixture.  FLUIDS:  1600 mL of crystalloid.  ESTIMATED BLOOD LOSS:  50 mL.  DRAINS:  A 19-French Blake drain placed along the sleeve staple line.  SPECIMENS:  Sleeve gastrectomy sent to Pathology for permanent sectioning.  COMPLICATIONS:  None apparent.  FINDINGS:  Vertical sleeve gastrectomy performed over 36-French bougie. Intraoperative leak test was negative for any leakage or stricturing.  A 19-French Blake drain was placed along the sleeve staple line.  INDICATION FOR PROCEDURE:  [name] is a 57 year old male with morbid obesity and several medical problems, who has failed medical weight loss attempts and desired durable weight loss solution.  OPERATIVE DETAILS:  [name] was seen and evaluated in the preoperative area and risks and benefits of procedure were discussed in lay terms.  Informed consent was obtained.  He was given prophylactic antibiotics and subcu heparin and taken to the operating room, placed on the table in supine position.  General endotracheal anesthesia was obtained and Foley catheter was placed.  His abdomen was prepped and draped in a standard surgical fashion.  Procedure time-out was performed with all operative team members to confirm proper patient, procedure. During the time-out, we  realized that he had not received preoperative subcu heparin, so this was administered in this right thigh.  Then, after the procedure time-out was complete, I accessed his abdomen in the left upper quadrant with a 5 mm Optiview trocar on the first attempt, pneumoperitoneum was obtained, and the laparoscope  was introduced, there was no evidence of bleeding or bowel injury upon entry.  A 15-mm right rectus port and a 5-mm right upper quadrant ports were all placed under direct visualization.  [name] liver retractor was passed through the epigastrium to retract the left lobe of liver.  Then, we inspected the abdomen, he did not appear to have any inflammatory changes around the gallbladder.  He did not have any significant adhesive disease in the upper abdomen.  No evidence of hiatal hernia and the anatomy appeared normal.  I then identify the pylorus and I measured about 5 cm from the pylorus and began dividing the short gastric vessels with the Harmonic Scalpel.  I entered the lesser sac, I carried this division around the greater curvature of the stomach with Harmonic scalpel.  The stomach was closely adherent to the spleen, but we were able to separate the stomach off the spleen as well as the left crus of the diaphragm.  We mobilized the cardia of the stomach off of  the left crus and after we felt that we had completely mobilized the greater curvature, and there was no evidence of any hiatal hernia.  We rolled the stomach to the patient's right and inspected the posterior stomach, there were no significant lesser curve adhesions or attachments.  He did have a J-shaped stomach, normal appearing variant.  I did measured 5 cm from the pylorus and removed all tubes from the stomach and I started creating dividing the stomach and creating my sleeve gastrectomy, the first staple fire was taken with a black 60-mm Tri-Staple load taking care to avoid narrowing the stomach at the  incisura.  A second black Tri- Staple load was placed on the stomach taking care to avoid a Christmas tree formation of the staple line, and, prior to firing the stapler, a 36-French bougie was passed through the mouth and along the lesser curvature of the stomach and into the antrum without re-gripping the stapler.  The stapler was fired.  I then transitioned to purple 60-mm Tri-Staple load, and carried the division along the bougie up towards the angle of His.  With each firing, I inspected anterior and posterior to ensure that there was no spiraling of the staple line, and checked the bougie with placement with each clamping of the stapler.  I completed the division of the stomach.  The last staple firing taking care to avoid staying off the esophagus and not incorporating any esophagus in the stapler.  With the stomach completely divided, the staple line was inspected for hemostasis.  There was a few areas of bleeding along the staple lines, and this was controlled with a few hemoclips.  Then, intraoperative endoscopy was performed by Dr. Andrey Campanile, who passed a well-lubricated fiberoptic endoscope through the mouth and into the esophagus and into the sleeve while I had the stomach submerged under water.  Abdomen was insufflated with air, we inspected for any air leakage and none was identified.  The sleeve appeared to be very straight without any stricturing.  There was some angulation at the angularis due to the normal variant J-shaped anatomy of the stomach, but there was certainly no evidence of any stricturing as we had noted this anatomy and cautious about narrowing the stomach at this spot.  There was no evidence of internal bleeding, and the scope again was easily driven to the pylorus.  The air was suctioned and the scope was removed. I suctioned the fluid from the abdomen and we applied sealed fiber loops on the staple lines.  The area of the spleen and left upper quadrant were  hemostatic as well.  I enlarged the 15-mm port site, and removed the resected portion of the stomach and sent to Pathology for permanent section.  I have placed a 19-French Blake drain in the abdomen and exited this through the left upper quadrant trocar site, I sutured in place with a nylon drain stitch.  I then again suctioned all the fluid and the abdomen appeared hemostatic without any evidence of bleeding. There was no evidence of leakage or bowel injury and the liver retractor was removed.  I placed the omentum up over the drain and the sleeve and then we approximated the fascia at the extraction site.  The right rectus muscle with interrupted 0 Vicryl sutures in open fashion. Sutures were secured and the abdomen was re-insufflated with carbon dioxide gas.  The abdominal wall closure was noted to be hemostatic and without any evidence of bowel injury.  The final trocars removed  and the wounds were irrigated with sterile saline solution and injected with total of 45 mL of 1% lidocaine with epinephrine and 0.25% Marcaine in a 50:50 mixture.  Skin edges were approximated with 4-0 Monocryl subcuticular suture and skin was washed and dried and Dermabond was applied.  All sponge, needle, and instrument counts correct at the end of the case.  The patient tolerated the procedure well without apparent complication.          ______________________________ Lodema Pilot, MD     BL/MEDQ  D:  12/06/2012  T:  12/07/2012  Job:  [id number]

## 2012-12-07 NOTE — Progress Notes (Signed)
I was called about bloody drainage from JP.  overnight and over last 4 hours.  His HR and BP remain stable.  HGB down to 9 from 12.  Will hold lovenox and avoid toradol.  Repeat HGB at 0800 as long as he is HD stable. Will check coags and type and screen.  If this is staple line bleed then this should stop, but will follow clinically and HGB and may need transfusion or reexploration.

## 2012-12-07 NOTE — Progress Notes (Signed)
Spoke with patient, concerned re: pain and bleeding, answered questions, will continue to monitor

## 2012-12-07 NOTE — Progress Notes (Signed)
UGI okay.  HR okay and vitals okay.  Repeat HGB 9.  Still has some bloody drainage but output seems to have slowed.  Will trial some sips and start oxycodone.

## 2012-12-07 NOTE — Care Management Note (Signed)
    Page 1 of 1   12/07/2012     10:21:33 AM   CARE MANAGEMENT NOTE 12/07/2012  Patient:  Fred Acosta, Fred Acosta   Account Number:  [id number]  Date Initiated:  12/07/2012  Documentation initiated by:  Lorenda Ishihara  Subjective/Objective Assessment:   57 yo male admitted s/p lap sleeve gastrectomy. PTA lived at home with spouse.     Action/Plan:   Home when stable   Anticipated DC Date:  12/09/2012   Anticipated DC Plan:  HOME/SELF CARE      DC Planning Services  CM consult      Choice offered to / List presented to:             Status of service:  Completed, signed off Medicare Important Message given?   (If response is "NO", the following Medicare IM given date fields will be blank) Date Medicare IM given:   Date Additional Medicare IM given:    Discharge Disposition:  HOME/SELF CARE  Per UR Regulation:  Reviewed for med. necessity/level of care/duration of stay  If discussed at Long Length of Stay Meetings, dates discussed:    Comments:

## 2012-12-08 LAB — BASIC METABOLIC PANEL
BUN: 45 mg/dL — ABNORMAL HIGH (ref 6–23)
CO2: 24 mEq/L (ref 19–32)
Calcium: 8.6 mg/dL (ref 8.4–10.5)
Chloride: 99 mEq/L (ref 96–112)
GFR calc Af Amer: 65 mL/min — ABNORMAL LOW (ref >90)
Glucose, Bld: 162 mg/dL — ABNORMAL HIGH (ref 70–99)
Potassium: 5.7 mEq/L — ABNORMAL HIGH (ref 3.5–5.1)

## 2012-12-08 LAB — HEMOGLOBIN AND HEMATOCRIT, BLOOD
HCT: 24.3 % — ABNORMAL LOW (ref 39.0–52.0)
Hemoglobin: 8.2 g/dL — ABNORMAL LOW (ref 13.0–17.0)

## 2012-12-08 LAB — CBC WITH DIFFERENTIAL/PLATELET
Basophils Relative: 0 % (ref 0–1)
HCT: 22 % — ABNORMAL LOW (ref 39.0–52.0)
Hemoglobin: 7.6 g/dL — ABNORMAL LOW (ref 13.0–17.0)
MCHC: 34.5 g/dL (ref 30.0–36.0)
Monocytes Absolute: 1 10*3/uL (ref 0.1–1.0)
Monocytes Relative: 10 % (ref 3–12)
Neutro Abs: 7.3 10*3/uL (ref 1.7–7.7)
RDW: 13.3 % (ref 11.5–15.5)

## 2012-12-08 LAB — PREPARE RBC (CROSSMATCH)

## 2012-12-08 MED ORDER — SODIUM CHLORIDE 0.9 % IV SOLN
INTRAVENOUS | Status: DC
  Administered 2012-12-08: 07:00:00 via INTRAVENOUS
  Administered 2012-12-09: 75 mL/h via INTRAVENOUS
  Administered 2012-12-10: 01:00:00 via INTRAVENOUS

## 2012-12-08 NOTE — Progress Notes (Signed)
2 Days Post-Op  Subjective: Feeling better.  Pain and cramping improved. ambulating  Objective: Vital signs in last 24 hours: Temp:  [98.1 F (36.7 C)-98.6 F (37 C)] 98.3 F (36.8 C) (11/27 0730) Pulse Rate:  [65-96] 89 (11/27 0730) Resp:  [15-17] 16 (11/27 0647) BP: (115-169)/(67-95) 154/70 mmHg (11/27 0730) SpO2:  [91 %-97 %] 91 % (11/27 0647) Last BM Date: 12/05/12  Intake/Output from previous day: 11/26 0701 - 11/27 0700 In: 3060 [P.O.:60; I.V.:3000] Out: 1330 [Urine:1100; Drains:230] Intake/Output this shift:    General appearance: alert, cooperative and no distress Resp: clear to auscultation bilaterally Cardio: normal rate, regular GI: soft, mild upper tenderness, ND, wounds okay, JP still bloody, no peritoneal signs Extremities: SCD's bilat  Lab Results:   Recent Labs  12/07/12 0415  12/07/12 1600 12/08/12 0445  WBC 11.2*  --   --  9.7  HGB 9.4*  < > 8.7* 7.6*  HCT 27.7*  < > 26.2* 22.0*  PLT 213  --   --  147*  < > = values in this interval not displayed. BMET  Recent Labs  12/07/12 0415 12/08/12 0445  NA 133* 132*  K 5.0 5.7*  CL 99 99  CO2 20 24  GLUCOSE 230* 162*  BUN 50* 45*  CREATININE 1.51* 1.35  CALCIUM 8.5 8.6   PT/INR  Recent Labs  12/07/12 0805  LABPROT 14.2  INR 1.12   ABG No results found for this basename: PHART, PCO2, PO2, HCO3,  in the last 72 hours  Studies/Results: Dg Ugi W/water Sol Cm  12/07/2012   CLINICAL DATA:  Postop sleeve gastrectomy yesterday.  EXAM: WATER SOLUBLE UPPER GI SERIES  TECHNIQUE: Single-column upper GI series was performed using water soluble contrast.  CONTRAST:  50mL OMNIPAQUE IOHEXOL 300 MG/ML  SOLN  COMPARISON:  Preoperative examinations 08/02/2012.  FLUOROSCOPY TIME:  23 seconds  FINDINGS: The scout abdominal radiograph demonstrates a surgical drain in the subphrenic area and surgical clips overlying the proximal stomach. The patient swallowed the contrast without difficulty. The esophageal  motility appears normal. The partitioned stomach opacifies normally. There is normal drainage into the duodenum. No extravasation is identified. No contrast enters the surgical drain.  IMPRESSION: No evidence of leak or other complication following sleeve gastrectomy.   Electronically Signed   By: Roxy Horseman M.D.   On: 12/07/2012 13:28    Anti-infectives: Anti-infectives   Start     Dose/Rate Route Frequency Ordered Stop   12/06/12 1145  ertapenem (INVANZ) 1 g in sodium chloride 0.9 % 50 mL IVPB     1 g 100 mL/hr over 30 Minutes Intravenous  Once 12/06/12 1133 12/06/12 1400      Assessment/Plan: s/p Procedure(s): LAPAROSCOPIC GASTRIC SLEEVE RESECTION (N/A) He says that he feels better today.  pain improving. no nausea. No weakness, SOB, CP, or diziness.  HGB still trending down and JP bloody, this appears to be a slow bleed and will hopefully stop soon.  Will transfuse 1 unit and reevaluate.  continue to hold anticoagulation for now, so continue scd's and mobilize.  Doing okay with diet.  LOS: 2 days    Lodema Pilot DAVID 12/08/2012

## 2012-12-09 ENCOUNTER — Ambulatory Visit (HOSPITAL_COMMUNITY): Payer: Managed Care, Other (non HMO)

## 2012-12-09 ENCOUNTER — Encounter (HOSPITAL_COMMUNITY): Payer: Self-pay | Admitting: Radiology

## 2012-12-09 LAB — CBC WITH DIFFERENTIAL/PLATELET
Basophils Relative: 0 % (ref 0–1)
Eosinophils Relative: 0 % (ref 0–5)
HCT: 21 % — ABNORMAL LOW (ref 39.0–52.0)
Hemoglobin: 7 g/dL — ABNORMAL LOW (ref 13.0–17.0)
Lymphocytes Relative: 29 % (ref 12–46)
MCHC: 33.3 g/dL (ref 30.0–36.0)
Monocytes Relative: 7 % (ref 3–12)
Neutro Abs: 5 10*3/uL (ref 1.7–7.7)
WBC: 7.7 10*3/uL (ref 4.0–10.5)

## 2012-12-09 LAB — BASIC METABOLIC PANEL
BUN: 23 mg/dL (ref 6–23)
CO2: 25 mEq/L (ref 19–32)
Chloride: 103 mEq/L (ref 96–112)
Glucose, Bld: 113 mg/dL — ABNORMAL HIGH (ref 70–99)
Potassium: 4.3 mEq/L (ref 3.5–5.1)

## 2012-12-09 LAB — HEMOGLOBIN AND HEMATOCRIT, BLOOD: HCT: 24.9 % — ABNORMAL LOW (ref 39.0–52.0)

## 2012-12-09 MED ORDER — LISINOPRIL 10 MG PO TABS
10.0000 mg | ORAL_TABLET | Freq: Every day | ORAL | Status: DC
  Administered 2012-12-09 – 2012-12-11 (×3): 10 mg via ORAL
  Filled 2012-12-09 (×5): qty 1

## 2012-12-09 MED ORDER — IOHEXOL 350 MG/ML SOLN
125.0000 mL | Freq: Once | INTRAVENOUS | Status: AC | PRN
  Administered 2012-12-09: 125 mL via INTRAVENOUS

## 2012-12-09 NOTE — Progress Notes (Signed)
Paged MD Biagio Quint regarding Hemoglobin results for patient, awaiting callback Stanford Breed RN 12-09-2012 15:41pm

## 2012-12-10 LAB — CBC
MCHC: 34.3 g/dL (ref 30.0–36.0)
Platelets: 124 10*3/uL — ABNORMAL LOW (ref 150–400)
RDW: 13.6 % (ref 11.5–15.5)

## 2012-12-10 LAB — BASIC METABOLIC PANEL
BUN: 17 mg/dL (ref 6–23)
Calcium: 8.5 mg/dL (ref 8.4–10.5)
Creatinine, Ser: 1.1 mg/dL (ref 0.50–1.35)
GFR calc Af Amer: 83 mL/min — ABNORMAL LOW (ref >90)
GFR calc non Af Amer: 72 mL/min — ABNORMAL LOW (ref >90)

## 2012-12-10 NOTE — Progress Notes (Signed)
4 Days Post-Op  Subjective: Feels great.  Pain well controlled.  Tolerating full liquids.  Objective: Vital signs in last 24 hours: Temp:  [98.1 F (36.7 C)-99.2 F (37.3 C)] 98.1 F (36.7 C) (11/29 4540) Pulse Rate:  [70-91] 70 (11/29 0608) Resp:  [14-18] 16 (11/29 0608) BP: (114-165)/(47-79) 126/68 mmHg (11/29 0608) SpO2:  [92 %-98 %] 94 % (11/29 0608) Last BM Date: 12/09/12  Intake/Output from previous day: 11/28 0701 - 11/29 0700 In: 2135.8 [I.V.:1315.8; Blood:820] Out: 455 [Urine:300; Drains:155] Intake/Output this shift:    General appearance: alert, cooperative and no distress Resp: nonlabored Cardio: normal rate, regular GI: soft, no significiant tenderness, ND, no peritoneal signs, JP thin bloody drainage Extremities: SCD's bilat  Lab Results:   Recent Labs  12/09/12 0420 12/09/12 1439 12/10/12 0450  WBC 7.7  --  6.8  HGB 7.0* 8.4* 8.7*  HCT 21.0* 24.9* 25.4*  PLT 132*  --  124*   BMET  Recent Labs  12/09/12 0500 12/10/12 0450  NA 137 135  K 4.3 4.0  CL 103 100  CO2 25 26  GLUCOSE 113* 102*  BUN 23 17  CREATININE 1.06 1.10  CALCIUM 8.5 8.5   PT/INR  Recent Labs  12/07/12 0805  LABPROT 14.2  INR 1.12   ABG No results found for this basename: PHART, PCO2, PO2, HCO3,  in the last 72 hours  Studies/Results: Ct Angio Abd/pel W/ And/or W/o  12/09/2012   CLINICAL DATA:  Obesity, status post laparoscopic gastric sleeve, bloody drain output, abdominal cramping, decreased hemoglobin requiring transfusion  EXAM: CT ANGIOGRAPHY ABDOMEN AND PELVIS WITH CONTRAST AND WITHOUT CONTRAST  TECHNIQUE: Multidetector CT imaging of the abdomen and pelvis was performed using the standard protocol during bolus administration of intravenous contrast. Multiplanar reconstructed images including MIPs were obtained and reviewed to evaluate the vascular anatomy.  CONTRAST:  OMNIPAQUE IOHEXOL 350 MG/ML SOLN  COMPARISON:  12/07/2012 upper GI, 08/02/2012 abdominal  ultrasound  FINDINGS: Minor dependent basilar atelectasis. No lower lobe pneumonia, airspace process or consolidation. Normal heart size. No pericardial or pleural effusion.  Abdomen: Left upper quadrant surgical drain noted along the gastric surgical site. Patient is status post laparoscopic gastric sleeve. Hyperdense staple line noted along the stomach. In the left abdomen, there is a hyperdense lobular fluid collection measuring 9.4 x 9.9 x 6.3 cm compatible with a hematoma. No evidence of active contrast extravasation or bleeding demonstrated. No free air. Small amount of free fluid noted along the stomach and splenic tip. No definite developing abscess. Negative for obstruction or ileus.  Liver, gallbladder, biliary system, mildly fatty replaced pancreas, spleen, adrenal glands, and kidneys are within normal limits for age and demonstrate no acute process.  Abdominal aorta demonstrates atherosclerotic change without aneurysm. Celiac, SMA, renal arteries, and IMA are all patent.  Negative for bowel obstruction, dilatation, ileus, or free air. Retained contrast within the colon from the recent upper GI exam. Stimulator battery pack in the right lower quadrant.  Pelvis: Small amount of hyperdense free fluid along the right pericolic gutter, image 115 compatible with a small amount of hemo peritoneum. Urinary bladder unremarkable. No large pelvic hematoma or fluid collection. No adenopathy, inguinal abnormality, or hernia. No acute distal bowel process.  Degenerative changes noted of the spine and pelvis.  Review of the MIP images confirms the above findings.  IMPRESSION: 10 cm anterior left upper quadrant intra-abdominal hematoma noted.  Postop changes from laparoscopic gastric sleeve procedure with a surgical drain along the stomach operative  site.  Negative for acute extravasation or active bleeding.  Negative for free air, obstruction or ileus.  Bibasilar atelectasis  Trace right lower quadrant pericolic gutter  hemo peritoneum likely from blood settling dependently.  These results were reviewed at the time of interpretation on 12/09/2012 at 9:56 AM to Dr. Lodema Pilot , who verbally acknowledged these results.   Electronically Signed   By: Ruel Favors M.D.   On: 12/09/2012 10:01    Anti-infectives: Anti-infectives   Start     Dose/Rate Route Frequency Ordered Stop   12/06/12 1145  ertapenem (INVANZ) 1 g in sodium chloride 0.9 % 50 mL IVPB     1 g 100 mL/hr over 30 Minutes Intravenous  Once 12/06/12 1133 12/06/12 1400      Assessment/Plan: s/p Procedure(s): LAPAROSCOPIC GASTRIC SLEEVE RESECTION (N/A) HGB appropriate today.  7.0 yesterday to 8.7 today.  He feels well.  If he continues to do well and HGB stable tomorrow, he may be okay for discharge tomorrow  LOS: 4 days    Lodema Pilot DAVID 12/10/2012

## 2012-12-11 LAB — CBC
Hemoglobin: 9.1 g/dL — ABNORMAL LOW (ref 13.0–17.0)
MCH: 30.1 pg (ref 26.0–34.0)
Platelets: 152 10*3/uL (ref 150–400)
RBC: 3.02 MIL/uL — ABNORMAL LOW (ref 4.22–5.81)
WBC: 5.6 10*3/uL (ref 4.0–10.5)

## 2012-12-11 NOTE — Progress Notes (Signed)
5 Days Post-Op  Subjective: No events.  Feeling well.  Objective: Vital signs in last 24 hours: Temp:  [97.9 F (36.6 C)-98.6 F (37 C)] 97.9 F (36.6 C) (11/30 0500) Pulse Rate:  [66-71] 71 (11/30 0500) Resp:  [18] 18 (11/30 0500) BP: (106-130)/(57-75) 130/75 mmHg (11/30 0500) SpO2:  [94 %-96 %] 96 % (11/30 0500) Last BM Date: 12/09/12  Intake/Output from previous day: 11/29 0701 - 11/30 0700 In: 250 [I.V.:200] Out: 835 [Urine:700; Drains:135] Intake/Output this shift:    General appearance: alert, cooperative and no distress Resp: nonlabored Cardio: HR normal, regular GI: soft, NT, ND, wounds without infection, JP still bloody but darker color Extremities: SCD's bilat  Lab Results:   Recent Labs  12/10/12 0450 12/11/12 0506  WBC 6.8 5.6  HGB 8.7* 9.1*  HCT 25.4* 26.7*  PLT 124* 152   BMET  Recent Labs  12/09/12 0500 12/10/12 0450  NA 137 135  K 4.3 4.0  CL 103 100  CO2 25 26  GLUCOSE 113* 102*  BUN 23 17  CREATININE 1.06 1.10  CALCIUM 8.5 8.5   PT/INR No results found for this basename: LABPROT, INR,  in the last 72 hours ABG No results found for this basename: PHART, PCO2, PO2, HCO3,  in the last 72 hours  Studies/Results: Ct Angio Abd/pel W/ And/or W/o  12/09/2012   CLINICAL DATA:  Obesity, status post laparoscopic gastric sleeve, bloody drain output, abdominal cramping, decreased hemoglobin requiring transfusion  EXAM: CT ANGIOGRAPHY ABDOMEN AND PELVIS WITH CONTRAST AND WITHOUT CONTRAST  TECHNIQUE: Multidetector CT imaging of the abdomen and pelvis was performed using the standard protocol during bolus administration of intravenous contrast. Multiplanar reconstructed images including MIPs were obtained and reviewed to evaluate the vascular anatomy.  CONTRAST:  OMNIPAQUE IOHEXOL 350 MG/ML SOLN  COMPARISON:  12/07/2012 upper GI, 08/02/2012 abdominal ultrasound  FINDINGS: Minor dependent basilar atelectasis. No lower lobe pneumonia, airspace  process or consolidation. Normal heart size. No pericardial or pleural effusion.  Abdomen: Left upper quadrant surgical drain noted along the gastric surgical site. Patient is status post laparoscopic gastric sleeve. Hyperdense staple line noted along the stomach. In the left abdomen, there is a hyperdense lobular fluid collection measuring 9.4 x 9.9 x 6.3 cm compatible with a hematoma. No evidence of active contrast extravasation or bleeding demonstrated. No free air. Small amount of free fluid noted along the stomach and splenic tip. No definite developing abscess. Negative for obstruction or ileus.  Liver, gallbladder, biliary system, mildly fatty replaced pancreas, spleen, adrenal glands, and kidneys are within normal limits for age and demonstrate no acute process.  Abdominal aorta demonstrates atherosclerotic change without aneurysm. Celiac, SMA, renal arteries, and IMA are all patent.  Negative for bowel obstruction, dilatation, ileus, or free air. Retained contrast within the colon from the recent upper GI exam. Stimulator battery pack in the right lower quadrant.  Pelvis: Small amount of hyperdense free fluid along the right pericolic gutter, image 115 compatible with a small amount of hemo peritoneum. Urinary bladder unremarkable. No large pelvic hematoma or fluid collection. No adenopathy, inguinal abnormality, or hernia. No acute distal bowel process.  Degenerative changes noted of the spine and pelvis.  Review of the MIP images confirms the above findings.  IMPRESSION: 10 cm anterior left upper quadrant intra-abdominal hematoma noted.  Postop changes from laparoscopic gastric sleeve procedure with a surgical drain along the stomach operative site.  Negative for acute extravasation or active bleeding.  Negative for free air, obstruction or  ileus.  Bibasilar atelectasis  Trace right lower quadrant pericolic gutter hemo peritoneum likely from blood settling dependently.  These results were reviewed at the  time of interpretation on 12/09/2012 at 9:56 AM to Dr. Lodema Pilot , who verbally acknowledged these results.   Electronically Signed   By: Ruel Favors M.D.   On: 12/09/2012 10:01    Anti-infectives: Anti-infectives   Start     Dose/Rate Route Frequency Ordered Stop   12/06/12 1145  ertapenem (INVANZ) 1 g in sodium chloride 0.9 % 50 mL IVPB     1 g 100 mL/hr over 30 Minutes Intravenous  Once 12/06/12 1133 12/06/12 1400      Assessment/Plan: s/p Procedure(s): LAPAROSCOPIC GASTRIC SLEEVE RESECTION (N/A) HGB finally appears to be stabilizing.  drain is still bloody but darker color.  abdominal bruising in late stages of healing.  his abdomen is appropriate and he is tolerating diet.  vitals are stable and HGB okay if HGB remains stable tomorrow, he should be ready for discharge.  We will probably leave the drain and remove next week in office.  LOS: 5 days    Lodema Pilot DAVID 12/11/2012

## 2012-12-12 ENCOUNTER — Telehealth (INDEPENDENT_AMBULATORY_CARE_PROVIDER_SITE_OTHER): Payer: Self-pay

## 2012-12-12 LAB — TYPE AND SCREEN
Antibody Screen: NEGATIVE
Unit division: 0

## 2012-12-12 LAB — HEMOGLOBIN AND HEMATOCRIT, BLOOD: Hemoglobin: 9.4 g/dL — ABNORMAL LOW (ref 13.0–17.0)

## 2012-12-12 MED ORDER — OXYCODONE HCL 5 MG/5ML PO SOLN: 10.0000 mg | ORAL | Status: AC | PRN

## 2012-12-12 MED ORDER — ONDANSETRON 4 MG PO TBDP: 4.0000 mg | ORAL_TABLET | Freq: Three times a day (TID) | ORAL | Status: AC | PRN

## 2012-12-12 NOTE — Progress Notes (Signed)
6 Days Post-Op  Subjective: Feels well.  Tolerating full liquids.  No fevers. And pain continues to decrease  Objective: Vital signs in last 24 hours: Temp:  [98.4 F (36.9 C)-99.3 F (37.4 C)] 98.4 F (36.9 C) (12/01 0430) Pulse Rate:  [64-69] 64 (12/01 0430) Resp:  [18-20] 20 (12/01 0430) BP: (115-142)/(51-69) 115/69 mmHg (12/01 0430) SpO2:  [95 %-98 %] 95 % (12/01 0430) Last BM Date: 12/11/12  Intake/Output from previous day: 11/30 0701 - 12/01 0700 In: 120 [P.O.:120] Out: 760 [Urine:600; Drains:160] Intake/Output this shift:    General appearance: alert, cooperative and no distress Resp: nonlabored Cardio: normal rate, regular GI: soft, no significant tenderness, ND, wounds okay.  JP much darker consistent with old blood and continues to thin Extremities: SCD's bialt  Lab Results:   Recent Labs  12/10/12 0450 12/11/12 0506 12/12/12 0525  WBC 6.8 5.6  --   HGB 8.7* 9.1* 9.4*  HCT 25.4* 26.7* 28.1*  PLT 124* 152  --    BMET  Recent Labs  12/10/12 0450  NA 135  K 4.0  CL 100  CO2 26  GLUCOSE 102*  BUN 17  CREATININE 1.10  CALCIUM 8.5   PT/INR No results found for this basename: LABPROT, INR,  in the last 72 hours ABG No results found for this basename: PHART, PCO2, PO2, HCO3,  in the last 72 hours  Studies/Results: No results found.  Anti-infectives: Anti-infectives   Start     Dose/Rate Route Frequency Ordered Stop   12/06/12 1145  ertapenem (INVANZ) 1 g in sodium chloride 0.9 % 50 mL IVPB     1 g 100 mL/hr over 30 Minutes Intravenous  Once 12/06/12 1133 12/06/12 1400      Assessment/Plan: s/p Procedure(s): LAPAROSCOPIC GASTRIC SLEEVE RESECTION (N/A) HGB has now stabilized.  no evidence of ongoing bleeding, the drain output is dark, c/w old blood.  he feels well and is tolerating diet without evidence of leak.  should be okay for discharge with 2 days of stable HGB.  will leave drain and remove later this week.  discharge instructions  reviewed.  LOS: 6 days    Lodema Pilot DAVID 12/12/2012

## 2012-12-12 NOTE — Telephone Encounter (Signed)
Called and left message with follow up appointment date & time for 12/15/12 @ 9:15 am.

## 2012-12-13 NOTE — Addendum Note (Signed)
Addendum created 12/13/12 1051 by Gaylan Gerold, MD   Modules edited: Anesthesia Responsible Staff

## 2012-12-14 ENCOUNTER — Telehealth: Payer: Self-pay | Admitting: Dietician

## 2012-12-14 NOTE — Telephone Encounter (Signed)
Recommended that Fred Acosta make an appointment at [facility] when he is feeling better to discuss his diet progression since he has not seen a dietitian since his July Nutrition Assessment.

## 2012-12-15 ENCOUNTER — Encounter (INDEPENDENT_AMBULATORY_CARE_PROVIDER_SITE_OTHER): Payer: Self-pay | Admitting: General Surgery

## 2012-12-15 ENCOUNTER — Ambulatory Visit (INDEPENDENT_AMBULATORY_CARE_PROVIDER_SITE_OTHER): Payer: Managed Care, Other (non HMO) | Admitting: General Surgery

## 2012-12-15 VITALS — BP 116/60 | HR 64 | Temp 97.8°F | Resp 18 | Ht 67.5 in | Wt 266.8 lb

## 2012-12-15 DIAGNOSIS — Z5189 Encounter for other specified aftercare: Secondary | ICD-10-CM

## 2012-12-15 DIAGNOSIS — Z4889 Encounter for other specified surgical aftercare: Secondary | ICD-10-CM

## 2012-12-15 LAB — HEMOGLOBIN: Hemoglobin: 10.2 g/dL — ABNORMAL LOW (ref 13.0–17.0)

## 2012-12-15 NOTE — Progress Notes (Signed)
Subjective:     Patient ID: Fred Acosta, male   DOB: 28-Jan-1955, 57 y.o.   MRN: [id number]  HPI This patient follows up for 1-1/2 weeks status post vertical sleeve gastrectomy. This was complicated by postoperative bleeding requiring transfusion of total of 3 units of blood. His hemoglobin eventually stabilized without any further intervention and he comes in today for removal of his drain. He says that drain output is decreasing and is darkened up significantly. There is no fresh blood. His abdominal pain seems to be improving as well and has no nausea or vomiting. He is tolerating his full liquid diet.  No SOB or chest pains or weakness.  Review of Systems     Objective:   Physical Exam No acute distress and nontoxic Abdomen soft and nontender to exam his incisions are healing nicely without any sign of infection he has some incisional bruising which is in the old stages of bruising. There is no sign of active infection. His JP drain has dark maroon blood consistent with resolving hematoma    Assessment:     Status post  vertical sleeve gastrectomy He is doing very well from his procedure note.he has no evidence of ongoing bleeding. He is tolerating his diet without nausea vomiting or reflux. His pain is controlled. His drain was removed today in the office as this was draining old blood. There is no evidence of leakage. I. Will check a hemoglobin today.  I suspect that he will have some easy fatigue due to the blood loss but he can gradually increase activity as tolerated and the energy should improve as his blood count improves and as he gets further out from surgery    Plan:     HGB today Follow up in 2-3 weeks Bariatric diet and exercise

## 2012-12-16 ENCOUNTER — Encounter (INDEPENDENT_AMBULATORY_CARE_PROVIDER_SITE_OTHER): Payer: Self-pay | Admitting: General Surgery

## 2012-12-17 NOTE — Discharge Summary (Signed)
Physician Discharge Summary  Patient ID: Fred Acosta MRN: [id number] DOB/AGE: 1955/04/04 57 y.o.  Admit date: 12/06/2012 Discharge date: 12/12/2012  Admission Diagnoses:obesity  Discharge Diagnoses: obesity Active Problems:   Obesity   Discharged Condition: stable  Hospital Course: TO OR for vertical sleeve gastrectomy/EGD.  He tolerated this well and no apparent complications.  On POD 1 he was noted to have drop in HGB and bloody drain output.  We felt that he was having intraabdominal bleeding.  UGI negative for leak.  The bleeding was not significant enough to warrant reoperation but over the next several days he required a total of 3 units of blood.  Anticoagulation held.  He was tolerating diet and HGB stabilized.  He was ready for discharge to home on POD 6  Consults: None  Significant Diagnostic Studies: UGI   Treatments: surgery: lap sleeve gastrectomy  Disposition: 01-Home or Self Care  Discharge Orders   Future Appointments Provider Department Dept Phone   12/27/2012 9:15 AM Delmer Islam, RD [facility] [phone]   01/18/2013 9:45 AM Atilano Ina, MD [facility], [place] [phone]   Future Orders Complete By Expires   Call MD for:  difficulty breathing, headache or visual disturbances  As directed    Call MD for:  extreme fatigue  As directed    Call MD for:  hives  As directed    Call MD for:  persistant dizziness or light-headedness  As directed    Call MD for:  persistant nausea and vomiting  As directed    Call MD for:  redness, tenderness, or signs of infection (pain, swelling, redness, odor or green/yellow discharge around incision site)  As directed    Call MD for:  severe uncontrolled pain  As directed    Call MD for:  temperature >100.4  As directed    Discharge instructions  As directed    Comments:     May shower tomorrow.   Call [phone] for follow up appointment with Biagio Quint in 3 days  and in 3 weeks. May start vitamins and protein supplements. May start full liquid diet for another 3 days, then pureed diet x1 week, then soft diet x1 week, then advance to high protein, low fat, low carb diet  Increase activity as tolerated. Drink plenty of fluids. Crush all meds or take liquid meds x4 weeks. Strip and record the drain output and notify the doctor for any change in the character of the drainage.   Increase activity slowly  As directed        Medication List    STOP taking these medications       Oxycodone HCl 10 MG Tabs  Replaced by:  oxyCODONE 5 MG/5ML solution      TAKE these medications       lisinopril 10 MG tablet  Commonly known as:  PRINIVIL,ZESTRIL  Take 10 mg by mouth every morning.     MORPHINE SULFATE MICROINFUSION IJ  Inject as directed continuous.     multivitamin with minerals Tabs tablet  Take 1 tablet by mouth daily.     ondansetron 4 MG disintegrating tablet  Commonly known as:  ZOFRAN ODT  Take 1 tablet (4 mg total) by mouth every 8 (eight) hours as needed for nausea or vomiting.     oxyCODONE 5 MG/5ML solution  Commonly known as:  ROXICODONE  Take 10 mLs (10 mg total) by mouth every 4 (four) hours as needed for severe  pain.     ZOLOFT 50 MG tablet  Generic drug:  sertraline  Take 50 mg by mouth every morning.         SignedLodema Pilot DAVID 12/17/2012, 1:30 PM

## 2012-12-27 ENCOUNTER — Encounter: Payer: Managed Care, Other (non HMO) | Attending: General Surgery | Admitting: Dietician

## 2012-12-27 DIAGNOSIS — Z713 Dietary counseling and surveillance: Secondary | ICD-10-CM | POA: Insufficient documentation

## 2012-12-27 NOTE — Patient Instructions (Addendum)
Goals:  Follow Phase 3A: High Protein   Eat 3-6 small meals/snacks, every 3-5 hrs  Increase lean protein foods to meet 80g goal - try to eat protein vs drinking protein  Increase fluid intake to 64oz +  Avoid drinking 15 minutes before, during and 30 minutes after eating  Aim for >30 min of physical activity daily  Work on eating really slowly and chewing thoroughly. Take 20 minutes to eat.

## 2012-12-27 NOTE — Progress Notes (Signed)
  Follow-up visit:  3 Weeks Post-Operative Sleeve Gastrectomy Surgery  Medical Nutrition Therapy:  Appt start time: 0915 end time:  0945.  Primary concerns today: Post-operative Bariatric Surgery Nutrition Management. Fred Acosta is tolerating foods well. Able to eat a lot of protein.   Surgery date: 12/06/2012 Surgery type: Sleeve  Starting weight at [facility]: 273 lbs on 08/02/12 Weight today: 260.5 lbs Weight change: 12.5 lbs Total weight lost: 12.5 lbs Weight loss goal: 180-200 lbs  TANITA  BODY COMP RESULTS  12/27/12   BMI (kg/m^2) 40.8   Fat Mass (lbs) 132.5   Fat Free Mass (lbs) 128.0   Total Body Water (lbs) 93.5    Preferred Learning Style:   No preference indicated   Learning Readiness:   Ready  24-hr recall: B (AM): 1/4-1/2 cup egg beater and 2 oz meat (20 g protein) Snk (AM): Yogurt Dannon Light and Fit (12 g)  L (PM): Protein drink whey isolate (1 scoop  In 8oz skim milk) (28 g) Snk (PM):2 oz meat (14 g protein) D (PM): 8 oz protein drink (28 gram)  Snk (PM): yogurt (12 g)  Fluid intake: 34 oz water,  3-8 oz protein drink almost 60 oz. fluid Estimated total protein intake: 114-148 g protein Medications:see list  Supplementation:  Taking  Using straws: No Drinking while eating: No Hair loss: No Carbonated beverages: No N/V/D/C: No Dumping syndrome: No  Recent physical activity:  Walking 15-20 minutes, and 10 days stationary bike most day  Progress Towards Goal(s):  In progress.  Handouts given during visit include:  Phase 3B High Protein + Non Starchy Vegetables  Phase 3A High Protein   Nutritional Diagnosis:  [place]-3.3 Overweight/obesity related to past poor dietary habits and physical inactivity as evidenced by patient w/ recent sleeve gatrectomy surgery following dietary guidelines for continued weight loss.    Intervention:  Nutrition education provided.  Teaching Method Utilized: Visual Auditory Hands on  Barriers to learning/adherence to  lifestyle change: none  Demonstrated degree of understanding via:  Teach Back   Monitoring/Evaluation:  Dietary intake, exercise, lap band fills, and body weight. Follow up in 1-2 months for 2-3 month post-op visit.

## 2013-01-18 ENCOUNTER — Encounter (INDEPENDENT_AMBULATORY_CARE_PROVIDER_SITE_OTHER): Payer: Self-pay | Admitting: General Surgery

## 2013-01-18 ENCOUNTER — Ambulatory Visit (INDEPENDENT_AMBULATORY_CARE_PROVIDER_SITE_OTHER): Payer: BC Managed Care – PPO | Admitting: General Surgery

## 2013-01-18 VITALS — BP 138/86 | HR 60 | Temp 98.5°F | Resp 14 | Ht 67.5 in | Wt 256.6 lb

## 2013-01-18 DIAGNOSIS — Z09 Encounter for follow-up examination after completed treatment for conditions other than malignant neoplasm: Secondary | ICD-10-CM

## 2013-01-18 NOTE — Patient Instructions (Signed)
Keep up the good work.  Eating techniques 20-20-20 (30-30-30) 20 chews, 20 seconds between bites of food, 20 minutes to eat; sometimes you may need 30 chews, 30 seconds etc Use your nondominant hand to eat with Put fork  Down between bites of food Use a child/infant size utensil Try not to eat while watching TV  Walk daily - set goals weekly to increase your daily step count

## 2013-01-19 NOTE — Progress Notes (Signed)
Subjective:     Patient ID: Fred Acosta, male   DOB: 02-12-1955, 58 y.o.   MRN: [id number]  HPI 58 year old morbidly obese Caucasian male comes in for followup after undergoing laparoscopic sleeve gastrectomy on November 25 by Dr. Biagio QuintLayton. He was last seen in the office on December 4. His weight at that time was 266 pounds. His postoperative course was complicated by bleeding which required a few extra days in the hospital as well as a 3 unit blood transfusion. Since his last appointment he states that he is doing well. He denies any nausea, vomiting, heartburn, abdominal pain, left upper back or shoulder pain. He feels more energetic. He is taking his supplements as directed. He is now walking up to about half a mile. He occasionally will get a full filling. He tends to eat fast. His bites are the size of a quarter.  Review of Systems     Objective:   Physical Exam BP 138/86  Pulse 60  Temp(Src) 98.5 F (36.9 C) (Temporal)  Resp 14  Ht 5' 7.5" (1.715 m)  Wt 256 lb 9.6 oz (116.393 kg)  BMI 39.57 kg/m2  Gen: alert, NAD, non-toxic appearing Pupils: equal, no scleral icterus Pulm: Lungs clear to auscultation, symmetric chest rise CV: regular rate and rhythm Abd: soft, nontender, nondistended. Well-healed trocar sites. No cellulitis. No incisional hernia Ext: no edema,  Skin: no rash, no jaundice     Assessment:     Status post laparoscopic sleeve gastrectomy by Dr. Biagio QuintLayton November 25 Osteoarthritis Obstructive sleep apnea-stable Hypertension-stable  Chronic pain    Plan:     He appears to be doing well without any signs of complication. Total weight loss Is around 18.4 pounds. We discussed that his weight loss should be a little bit higher by now he is somewhat limited with his activity. We discussed strategies such as water aerobics. I encouraged him to get a pedometer and track his daily steps and then set weekly goals to increase his daily steps count. We also spent time  discussing proper eating techniques. We discussed the importance of eating slowly. He was reminded that his bites should be very small, he should choose 20-30 times, and wait 20-30 seconds between bites of food. He was reminded to continue taking his supplements as as directed. He was also encouraged to followup with the nutritionist as directed as well. Followup 3 months.  Mary SellaEric M. Andrey CampanileWilson, MD, FACS General, Bariatric, & Minimally Invasive Surgery [facility] [place]

## 2013-03-21 ENCOUNTER — Encounter: Payer: BC Managed Care – PPO | Attending: General Surgery | Admitting: Dietician

## 2013-03-21 DIAGNOSIS — Z713 Dietary counseling and surveillance: Secondary | ICD-10-CM | POA: Insufficient documentation

## 2013-03-21 NOTE — Progress Notes (Signed)
   Follow-up visit:  13 Weeks Post-Operative Gastric Sleeve Surgery  Medical Nutrition Therapy:  Appt start time: 0930 end time:  1000.  Primary concerns today: Post-operative Bariatric Surgery Nutrition Management. States he has a pain in his stomach all the time and doesn't enjoy eating like he used to except yogurt. However he is tolerating all foods and will sometimes eat to help with the pain in his stomach. Not getting in enough fluid and not consistently exercising.   Surgery date: 12/06/2012 Surgery type: Sleeve  [facility] lbs on 08/02/12 (278 lbs highest per pt report) Weight today: 244.5 lbs  Weight change: 16 lbs Total weight lost: 28.5 lbs Weight loss goal: 180-200 lbs  TANITA  BODY COMP RESULTS  12/27/12 03/21/13   BMI (kg/m^2) 40.8 38.3   Fat Mass (lbs) 132.5 114.5   Fat Free Mass (lbs) 128.0 130.0   Total Body Water (lbs) 93.5 95.0   Preferred Learning Style:   No preference indicated   Learning Readiness:   Ready  24-hr recall: B (AM): 2 eggs (12 g) Snk (AM): 1-2 yogurt greek (12-24 g) or string cheese L (PM):2-3 oz chicken (14-21 g) Snk (PM): 1-2 yogurt greek (12-24 g)    D (PM): 3-4 oz meat with steamed vegetables (21-28 g) Snk (PM): 1-2 yogurt greek (12-24 g)     Fluid intake: water 24-36 oz Estimated total protein intake: 83-97 g  Medications: see list Supplementation: taking  Using straws: No Drinking while eating: No Hair loss: No Carbonated beverages: No N/V/D/C: stomach discomfort every day though still able to eat Dumping syndrome: No  Recent physical activity:  2-3 x week for 20-30 minutes  Progress Towards Goal(s):  In progress.  Handouts given during visit include:  Phase 3B High Protein and Non Starchy Vegetables   Nutritional Diagnosis:  [place]-3.3 Overweight/obesity related to past poor dietary habits and physical inactivity as evidenced by patient w/ recent sleeve surgery following dietary guidelines for  continued weight loss.    Intervention:  Nutrition education/diet advancement.  Teaching Method Utilized:  Visual Auditory Hands on  Barriers to learning/adherence to lifestyle change: stomach pain, eating frequently  Demonstrated degree of understanding via:  Teach Back   Monitoring/Evaluation:  Dietary intake, exercise, lap band fills, and body weight. Follow up in 3 months for 7 month post-op visit.

## 2013-03-21 NOTE — Patient Instructions (Addendum)
Goals:  Follow Phase 3B: High Protein + Non-Starchy Vegetables  Eat 3-6 small meals/snacks, every 3-5 hrs  Increase lean protein foods to meet 80g goal  Increase fluid intake to 64oz +  Limit 15 grams of carbohydrate (fruit, whole grain, starchy vegetable) with meals  Avoid drinking 15 minutes before, during and 30 minutes after eating  Aim for >30 min of physical activity daily  Limit yogurt to 1-2 per day and have 1 protein shake per day if needed.   For meals/snacks try chicken salad, tuna salad, cheese sticks, or refried beans and cheese.  Eat protein first then non starchy vegetables.

## 2013-04-18 ENCOUNTER — Encounter (INDEPENDENT_AMBULATORY_CARE_PROVIDER_SITE_OTHER): Payer: Self-pay | Admitting: General Surgery

## 2013-06-21 ENCOUNTER — Ambulatory Visit: Payer: Managed Care, Other (non HMO) | Admitting: Dietician

## 2013-11-01 ENCOUNTER — Telehealth: Payer: Self-pay | Admitting: Dietician

## 2013-11-01 NOTE — Telephone Encounter (Signed)
Responded to an email from [place] about brittle nails. Discussed proper protein, vitamins, and mineral supplements recommended post op. Encouraged him to make an appointment if he has concerns that he is not meeting his protein or vitamin requirements. Also suggested that he talk to his doctor about getting blood work to test for any deficiencies.

## 2015-04-18 IMAGING — CR DG UGI W/ KUB
15 of 19 series · 15 of 19 positions shown · non-contrast
Comparison: None.

CLINICAL DATA: Morbid obesity.  Bariatric screening evaluation.
Chronic back pain.  No gastric or swallowing issues

UPPER GI SERIES W/ KUB
TECHNIQUE: After obtaining a scout radiograph a single-column
upper GI series was performed using thin barium.
Fluoroscopy Time: 2 minutes 48 seconds

[run (1 of 9)]
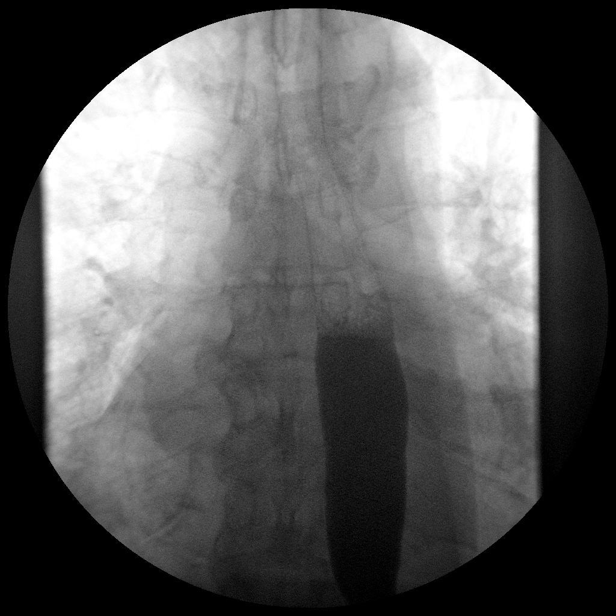

[run (2 of 9)]
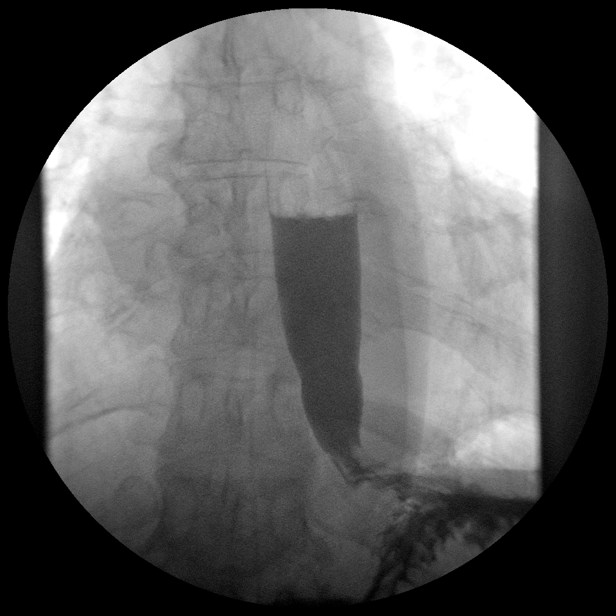

[run (3 of 9)]
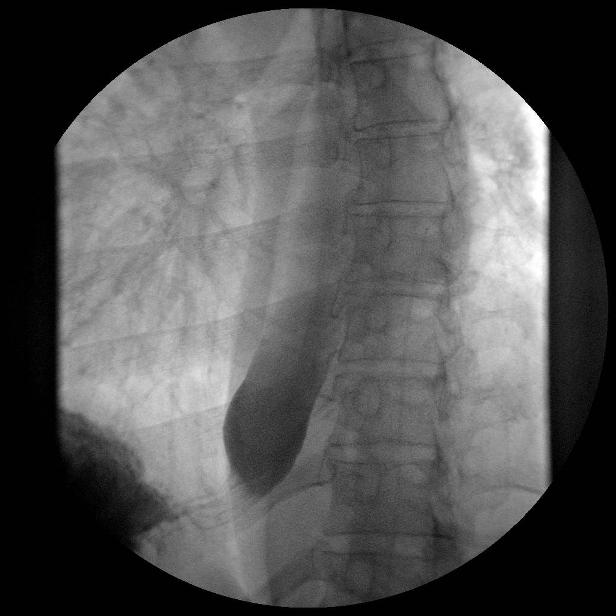

[run (4 of 9)]
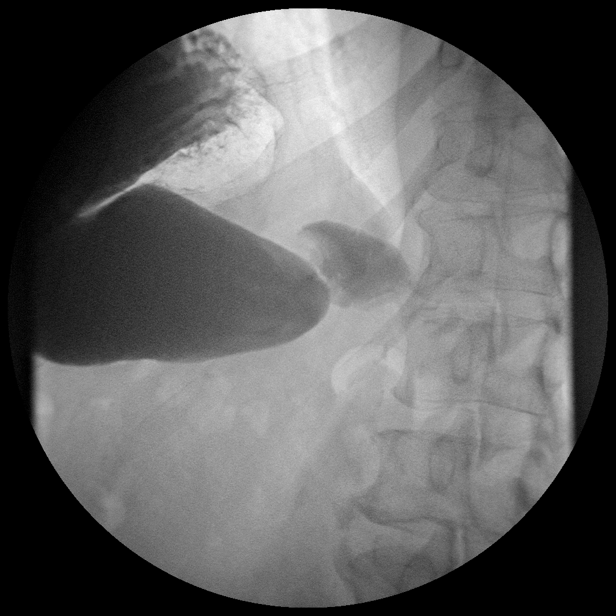

[run (5 of 9)]
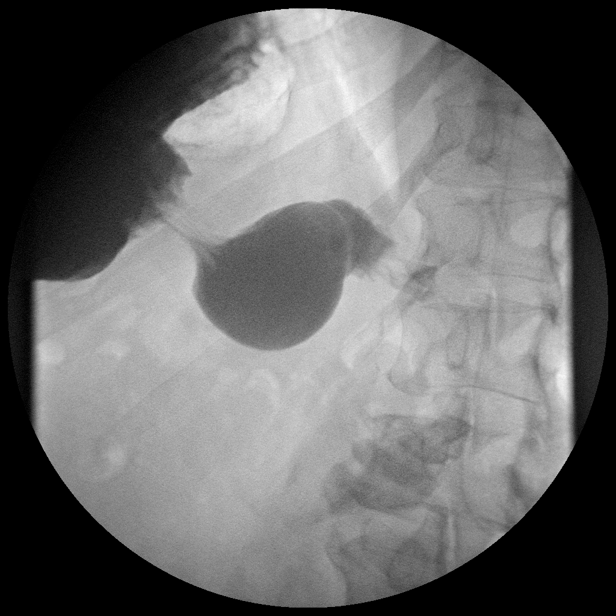

[run (6 of 9)]
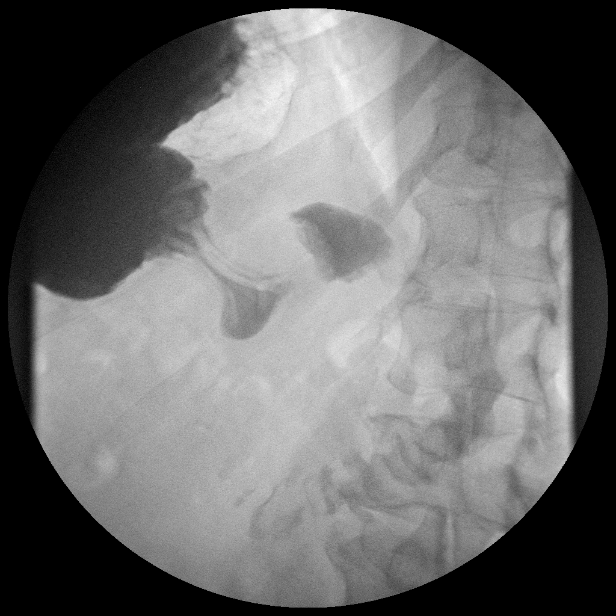

[run (7 of 9)]
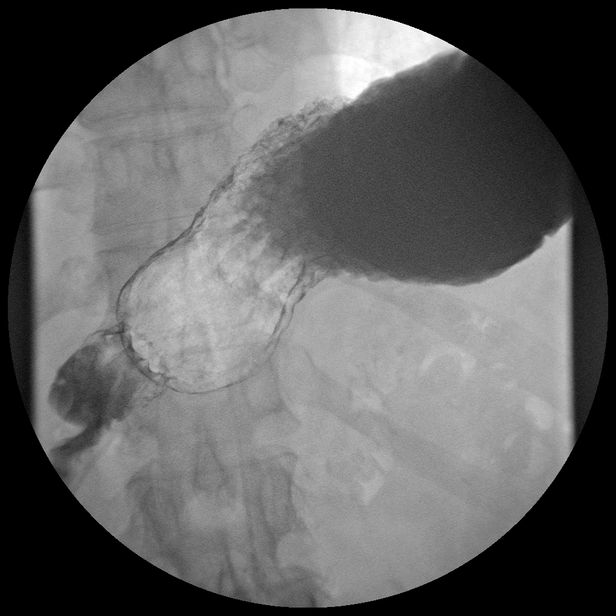

[run (8 of 9)]
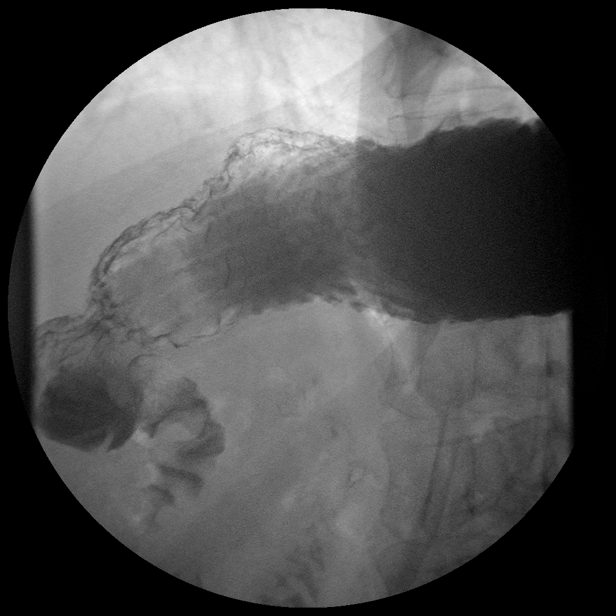

[run (9 of 9)]
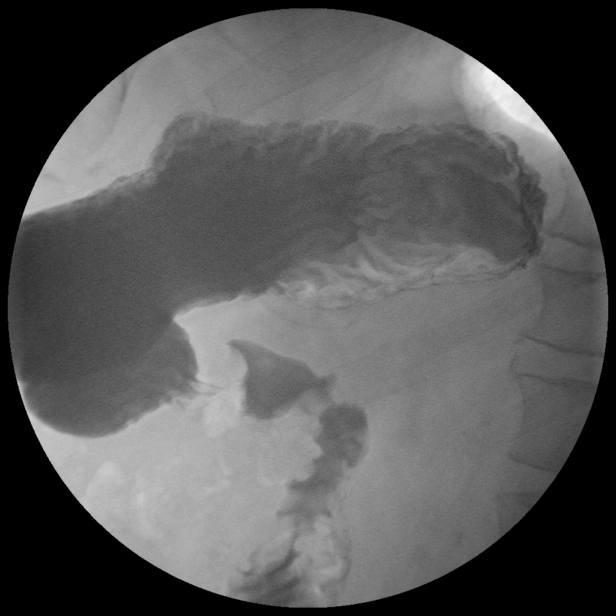

[view not recorded (1 of 6)]
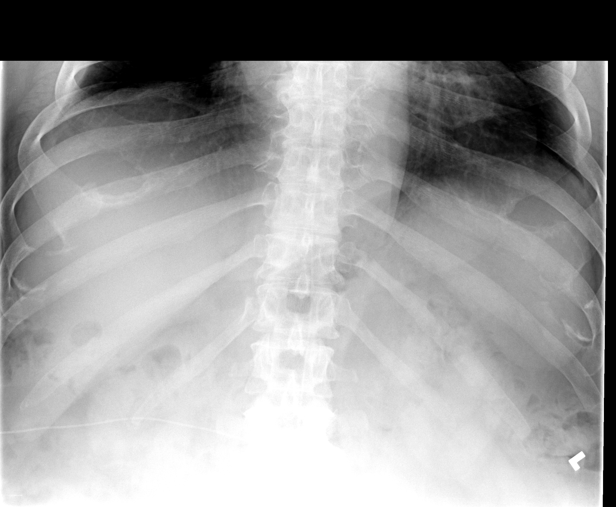

[view not recorded (2 of 6)]
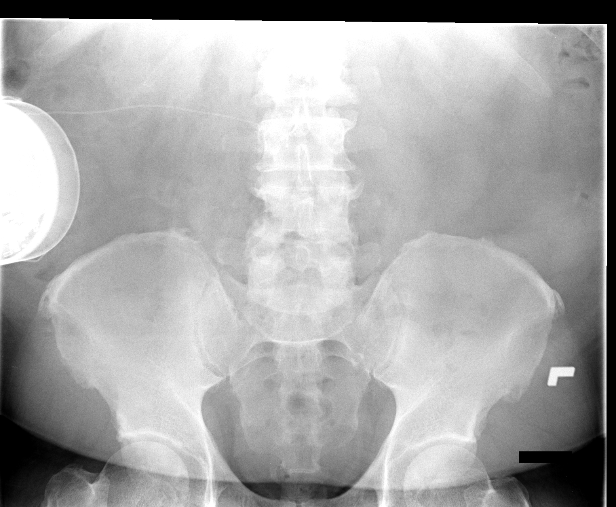

[view not recorded (3 of 6)]
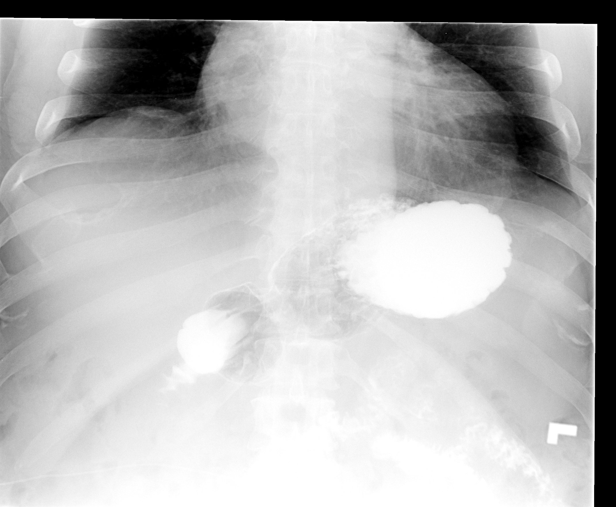

[view not recorded (4 of 6)]
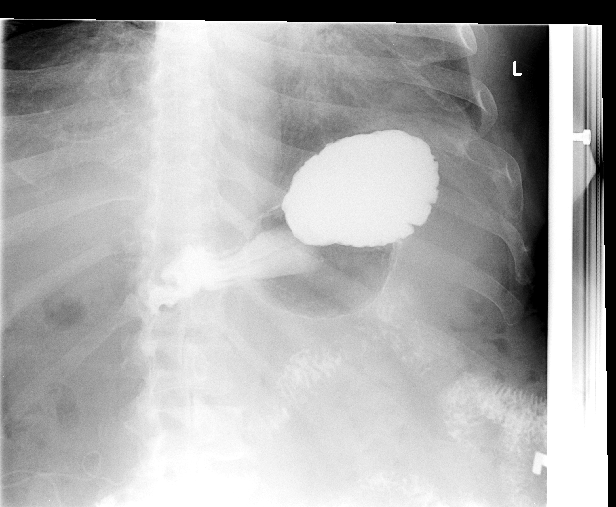

[view not recorded (5 of 6)]
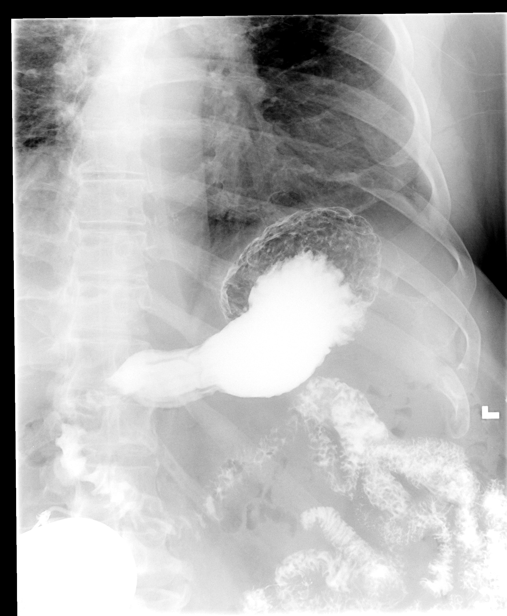

[view not recorded (6 of 6)]
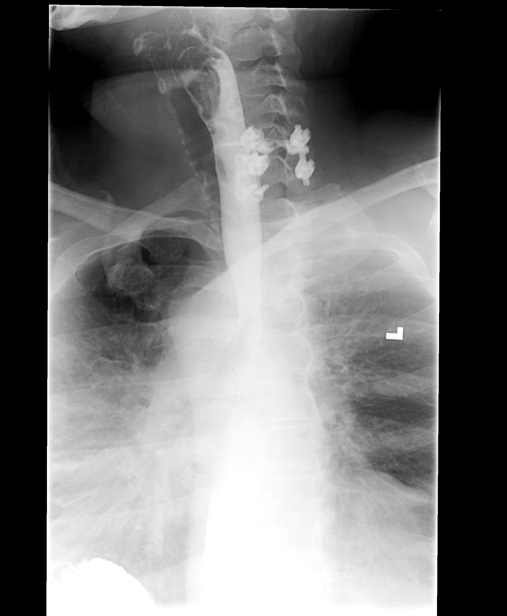

[15 of 19 positions shown; findings below may reference images not displayed]

FINDINGS: KUB:  A nonobstructive bowel gas pattern is seen. The
patient's epidural pump is seen overlying the right lower quadrant
with infusion line overlying the lower thoracic and upper lumbar
spine.  Degenerative change of the lower lumbar spine is noted.
Mild calcification in the descending aorta is seen.  No other
abnormal calcifications are noted.

Upper GI:  The patient was able swallow barium without difficulty.
Esophageal mucosa and motility appear within normal limits.  No
evidence for hiatal hernia or gastroesophageal reflux was seen. The
gastric mucosa and motility appeared within normal limits.  The
duodenal bulb was well distended has a normal morphology.
Visualized small bowel to the mid jejunum appears normal.

On swallowing views, screw rod fixation of the lower cervical spine
is evident
IMPRESSION: Normal upper GI

## 2016-07-06 ENCOUNTER — Encounter (HOSPITAL_COMMUNITY): Payer: Self-pay
